# Patient Record
Sex: Female | Born: 1935 | Race: White | Hispanic: No | Marital: Married
Health system: Southern US, Community
[De-identification: ages and names within clinical notes are randomized; demographics above are authoritative.]

---

## 2012-10-08 ENCOUNTER — Encounter: Payer: Self-pay | Admitting: Internal Medicine

## 2012-10-09 LAB — PROTIME-INR: INR: 1.3

## 2012-10-23 LAB — PROTIME-INR: Prothrombin Time: 25.9 secs — ABNORMAL HIGH (ref 11.5–14.7)

## 2012-10-31 ENCOUNTER — Encounter: Payer: Self-pay | Admitting: Internal Medicine

## 2012-11-06 LAB — PROTIME-INR
INR: 3
Prothrombin Time: 29.9 secs — ABNORMAL HIGH (ref 11.5–14.7)

## 2012-12-09 ENCOUNTER — Encounter: Payer: Self-pay | Admitting: Internal Medicine

## 2012-12-09 LAB — PROTIME-INR: Prothrombin Time: 29.6 secs — ABNORMAL HIGH (ref 11.5–14.7)

## 2012-12-24 ENCOUNTER — Ambulatory Visit: Payer: Self-pay | Admitting: Internal Medicine

## 2012-12-31 ENCOUNTER — Encounter: Payer: Self-pay | Admitting: Internal Medicine

## 2013-01-08 LAB — PROTIME-INR: INR: 4.7

## 2013-01-15 LAB — BASIC METABOLIC PANEL
BUN: 26 mg/dL — ABNORMAL HIGH (ref 7–18)
Calcium, Total: 9 mg/dL (ref 8.5–10.1)
Chloride: 102 mmol/L (ref 98–107)
Creatinine: 0.74 mg/dL (ref 0.60–1.30)
Osmolality: 285 (ref 275–301)
Potassium: 3.3 mmol/L — ABNORMAL LOW (ref 3.5–5.1)
Sodium: 140 mmol/L (ref 136–145)

## 2013-01-15 LAB — PROTIME-INR
INR: 1.1
Prothrombin Time: 14.4 secs (ref 11.5–14.7)

## 2013-01-22 LAB — PROTIME-INR
INR: 2.5
Prothrombin Time: 26 secs — ABNORMAL HIGH (ref 11.5–14.7)

## 2013-01-29 LAB — PROTIME-INR: Prothrombin Time: 43.9 secs — ABNORMAL HIGH (ref 11.5–14.7)

## 2013-01-31 ENCOUNTER — Encounter: Payer: Self-pay | Admitting: Internal Medicine

## 2013-02-03 LAB — PROTIME-INR: Prothrombin Time: 31 secs — ABNORMAL HIGH (ref 11.5–14.7)

## 2013-02-10 LAB — PROTIME-INR
INR: 2.7
Prothrombin Time: 27.9 secs — ABNORMAL HIGH (ref 11.5–14.7)

## 2013-02-17 LAB — PROTIME-INR
INR: 2.9
Prothrombin Time: 29.8 secs — ABNORMAL HIGH (ref 11.5–14.7)

## 2013-02-24 LAB — PROTIME-INR: INR: 5.6

## 2013-02-24 LAB — TSH: Thyroid Stimulating Horm: 56.9 u[IU]/mL — ABNORMAL HIGH

## 2013-03-02 ENCOUNTER — Encounter: Payer: Self-pay | Admitting: Internal Medicine

## 2013-03-05 LAB — PROTIME-INR
INR: 1.9
Prothrombin Time: 21.3 secs — ABNORMAL HIGH (ref 11.5–14.7)

## 2013-03-12 LAB — PROTIME-INR
INR: 4.5
INR: 4.3
Prothrombin Time: 40.5 secs — ABNORMAL HIGH (ref 11.5–14.7)

## 2013-03-17 LAB — PROTIME-INR: Prothrombin Time: 22.5 secs — ABNORMAL HIGH (ref 11.5–14.7)

## 2013-03-24 LAB — PROTIME-INR: INR: 2.8

## 2013-03-31 LAB — PROTIME-INR: Prothrombin Time: 29.6 secs — ABNORMAL HIGH (ref 11.5–14.7)

## 2013-04-02 ENCOUNTER — Encounter: Payer: Self-pay | Admitting: Internal Medicine

## 2013-04-07 LAB — TSH: THYROID STIMULATING HORM: 10.9 u[IU]/mL — AB

## 2013-04-07 LAB — PROTIME-INR
INR: 4.9 — AB
Prothrombin Time: 43.7 secs — ABNORMAL HIGH (ref 11.5–14.7)

## 2013-04-09 LAB — PROTIME-INR
INR: 2.9
Prothrombin Time: 29.3 secs — ABNORMAL HIGH (ref 11.5–14.7)

## 2013-04-14 LAB — PROTIME-INR
INR: 1.8
Prothrombin Time: 20.5 secs — ABNORMAL HIGH (ref 11.5–14.7)

## 2013-04-21 LAB — PROTIME-INR
INR: 1.7
PROTHROMBIN TIME: 19.2 s — AB (ref 11.5–14.7)

## 2013-04-28 LAB — PROTIME-INR
INR: 1.8
Prothrombin Time: 20.4 secs — ABNORMAL HIGH (ref 11.5–14.7)

## 2013-05-03 ENCOUNTER — Encounter: Payer: Self-pay | Admitting: Internal Medicine

## 2013-05-05 LAB — PROTIME-INR
INR: 1.8
PROTHROMBIN TIME: 20.3 s — AB (ref 11.5–14.7)

## 2013-05-19 LAB — PROTIME-INR
INR: 2.3
PROTHROMBIN TIME: 24.3 s — AB (ref 11.5–14.7)

## 2013-05-31 ENCOUNTER — Encounter: Payer: Self-pay | Admitting: Internal Medicine

## 2013-06-02 LAB — PROTIME-INR
INR: 2.3
Prothrombin Time: 25.1 secs — ABNORMAL HIGH (ref 11.5–14.7)

## 2013-06-25 LAB — PROTIME-INR
INR: 2.1
Prothrombin Time: 23.2 secs — ABNORMAL HIGH (ref 11.5–14.7)

## 2013-07-01 ENCOUNTER — Encounter: Payer: Self-pay | Admitting: Internal Medicine

## 2013-07-02 LAB — PROTIME-INR
INR: 2
PROTHROMBIN TIME: 21.8 s — AB (ref 11.5–14.7)

## 2013-07-09 LAB — PROTIME-INR
INR: 2.1
Prothrombin Time: 23 secs — ABNORMAL HIGH (ref 11.5–14.7)

## 2013-07-21 LAB — PROTIME-INR
INR: 2.3
Prothrombin Time: 25.1 secs — ABNORMAL HIGH (ref 11.5–14.7)

## 2013-07-31 ENCOUNTER — Encounter: Payer: Self-pay | Admitting: Internal Medicine

## 2013-08-04 LAB — BASIC METABOLIC PANEL
ANION GAP: 6 — AB (ref 7–16)
BUN: 33 mg/dL — ABNORMAL HIGH (ref 7–18)
Calcium, Total: 8.9 mg/dL (ref 8.5–10.1)
Chloride: 105 mmol/L (ref 98–107)
Co2: 34 mmol/L — ABNORMAL HIGH (ref 21–32)
Creatinine: 1 mg/dL (ref 0.60–1.30)
EGFR (African American): 57 — ABNORMAL LOW
EGFR (Non-African Amer.): 49 — ABNORMAL LOW
Glucose: 85 mg/dL (ref 65–99)
OSMOLALITY: 295 (ref 275–301)
Potassium: 3.5 mmol/L (ref 3.5–5.1)
Sodium: 145 mmol/L (ref 136–145)

## 2013-08-04 LAB — PROTIME-INR
INR: 2.6
Prothrombin Time: 27.2 secs — ABNORMAL HIGH (ref 11.5–14.7)

## 2013-08-04 LAB — TSH: THYROID STIMULATING HORM: 5.95 u[IU]/mL — AB

## 2013-08-18 LAB — PROTIME-INR
INR: 2.3
Prothrombin Time: 25.1 secs — ABNORMAL HIGH (ref 11.5–14.7)

## 2013-08-27 ENCOUNTER — Ambulatory Visit: Payer: Self-pay | Admitting: Gerontology

## 2013-08-31 ENCOUNTER — Encounter: Payer: Self-pay | Admitting: Internal Medicine

## 2013-09-15 LAB — PROTIME-INR
INR: 2.7
Prothrombin Time: 27.9 secs — ABNORMAL HIGH (ref 11.5–14.7)

## 2013-09-29 LAB — PROTIME-INR
INR: 2.2
PROTHROMBIN TIME: 24.1 s — AB (ref 11.5–14.7)

## 2013-09-30 ENCOUNTER — Encounter: Payer: Self-pay | Admitting: Internal Medicine

## 2013-10-13 LAB — PROTIME-INR
INR: 2.4
Prothrombin Time: 25.9 secs — ABNORMAL HIGH (ref 11.5–14.7)

## 2013-10-27 LAB — PROTIME-INR
INR: 1.8
Prothrombin Time: 20.5 secs — ABNORMAL HIGH (ref 11.5–14.7)

## 2013-10-31 ENCOUNTER — Encounter: Payer: Self-pay | Admitting: Internal Medicine

## 2013-11-03 LAB — PROTIME-INR
INR: 2.2
Prothrombin Time: 23.7 secs — ABNORMAL HIGH (ref 11.5–14.7)

## 2013-11-10 LAB — PROTIME-INR
INR: 2.8
PROTHROMBIN TIME: 28.8 s — AB (ref 11.5–14.7)

## 2013-11-17 LAB — PROTIME-INR
INR: 2.7
Prothrombin Time: 28.3 secs — ABNORMAL HIGH (ref 11.5–14.7)

## 2013-12-01 ENCOUNTER — Encounter: Payer: Self-pay | Admitting: Internal Medicine

## 2013-12-01 LAB — PROTIME-INR
INR: 2.5
Prothrombin Time: 26.5 secs — ABNORMAL HIGH (ref 11.5–14.7)

## 2013-12-15 LAB — PROTIME-INR
INR: 2.8
Prothrombin Time: 28.4 secs — ABNORMAL HIGH (ref 11.5–14.7)

## 2013-12-25 LAB — PROTIME-INR
INR: 2
Prothrombin Time: 22.2 secs — ABNORMAL HIGH (ref 11.5–14.7)

## 2013-12-29 LAB — PROTIME-INR
INR: 3.9
PROTHROMBIN TIME: 36.7 s — AB (ref 11.5–14.7)

## 2013-12-31 ENCOUNTER — Encounter: Payer: Self-pay | Admitting: Internal Medicine

## 2014-01-04 LAB — PROTIME-INR
INR: 4.5 — AB
PROTHROMBIN TIME: 41.3 s — AB (ref 11.5–14.7)

## 2014-01-06 LAB — PROTIME-INR
INR: 3.5
Prothrombin Time: 34.2 secs — ABNORMAL HIGH (ref 11.5–14.7)

## 2014-01-08 ENCOUNTER — Ambulatory Visit: Payer: Self-pay | Admitting: Internal Medicine

## 2014-01-09 ENCOUNTER — Inpatient Hospital Stay: Payer: Self-pay | Admitting: Internal Medicine

## 2014-01-09 LAB — CBC
HCT: 29.4 % — ABNORMAL LOW (ref 35.0–47.0)
HGB: 9.2 g/dL — ABNORMAL LOW (ref 12.0–16.0)
MCH: 30.3 pg (ref 26.0–34.0)
MCHC: 31.3 g/dL — ABNORMAL LOW (ref 32.0–36.0)
MCV: 97 fL (ref 80–100)
PLATELETS: 206 10*3/uL (ref 150–440)
RBC: 3.04 10*6/uL — ABNORMAL LOW (ref 3.80–5.20)
RDW: 14.3 % (ref 11.5–14.5)
WBC: 13.1 10*3/uL — ABNORMAL HIGH (ref 3.6–11.0)

## 2014-01-09 LAB — PRO B NATRIURETIC PEPTIDE: B-TYPE NATIURETIC PEPTID: 7713 pg/mL — AB (ref 0–450)

## 2014-01-09 LAB — COMPREHENSIVE METABOLIC PANEL
ALK PHOS: 53 U/L
ALT: 12 U/L — AB
Albumin: 2.6 g/dL — ABNORMAL LOW (ref 3.4–5.0)
Anion Gap: 5 — ABNORMAL LOW (ref 7–16)
BILIRUBIN TOTAL: 1 mg/dL (ref 0.2–1.0)
BUN: 15 mg/dL (ref 7–18)
CO2: 32 mmol/L (ref 21–32)
Calcium, Total: 8.1 mg/dL — ABNORMAL LOW (ref 8.5–10.1)
Chloride: 106 mmol/L (ref 98–107)
Creatinine: 0.78 mg/dL (ref 0.60–1.30)
Glucose: 100 mg/dL — ABNORMAL HIGH (ref 65–99)
Potassium: 4.7 mmol/L (ref 3.5–5.1)
SGOT(AST): 29 U/L (ref 15–37)
Sodium: 143 mmol/L (ref 136–145)
Total Protein: 5.8 g/dL — ABNORMAL LOW (ref 6.4–8.2)

## 2014-01-09 LAB — PROTIME-INR
INR: 2.5
INR: 1.8
PROTHROMBIN TIME: 26.4 s — AB (ref 11.5–14.7)
Prothrombin Time: 20.8 secs — ABNORMAL HIGH (ref 11.5–14.7)

## 2014-01-09 LAB — CK TOTAL AND CKMB (NOT AT ARMC)
CK, Total: 58 U/L
CK-MB: 0.6 ng/mL (ref 0.5–3.6)

## 2014-01-09 LAB — TROPONIN I: TROPONIN-I: 0.06 ng/mL — AB

## 2014-01-10 ENCOUNTER — Ambulatory Visit: Payer: Self-pay | Admitting: Orthopedic Surgery

## 2014-01-10 LAB — TROPONIN I: TROPONIN-I: 0.05 ng/mL

## 2014-01-10 LAB — SEDIMENTATION RATE: ERYTHROCYTE SED RATE: 69 mm/h — AB (ref 0–30)

## 2014-01-11 LAB — CBC WITH DIFFERENTIAL/PLATELET
Basophil #: 0 10*3/uL (ref 0.0–0.1)
Basophil %: 0.6 %
EOS PCT: 2.6 %
Eosinophil #: 0.2 10*3/uL (ref 0.0–0.7)
HCT: 24.7 % — ABNORMAL LOW (ref 35.0–47.0)
HGB: 8.1 g/dL — ABNORMAL LOW (ref 12.0–16.0)
Lymphocyte #: 1.3 10*3/uL (ref 1.0–3.6)
Lymphocyte %: 17.4 %
MCH: 31.4 pg (ref 26.0–34.0)
MCHC: 32.6 g/dL (ref 32.0–36.0)
MCV: 96 fL (ref 80–100)
Monocyte #: 0.7 x10 3/mm (ref 0.2–0.9)
Monocyte %: 9.7 %
NEUTROS ABS: 5.2 10*3/uL (ref 1.4–6.5)
Neutrophil %: 69.7 %
Platelet: 166 10*3/uL (ref 150–440)
RBC: 2.57 10*6/uL — ABNORMAL LOW (ref 3.80–5.20)
RDW: 14.5 % (ref 11.5–14.5)
WBC: 7.5 10*3/uL (ref 3.6–11.0)

## 2014-01-11 LAB — BASIC METABOLIC PANEL
ANION GAP: 5 — AB (ref 7–16)
BUN: 20 mg/dL — ABNORMAL HIGH (ref 7–18)
CREATININE: 0.93 mg/dL (ref 0.60–1.30)
Calcium, Total: 8.2 mg/dL — ABNORMAL LOW (ref 8.5–10.1)
Chloride: 103 mmol/L (ref 98–107)
Co2: 33 mmol/L — ABNORMAL HIGH (ref 21–32)
EGFR (Non-African Amer.): >60
GLUCOSE: 84 mg/dL (ref 65–99)
Osmolality: 283 (ref 275–301)
POTASSIUM: 4.5 mmol/L (ref 3.5–5.1)
Sodium: 141 mmol/L (ref 136–145)

## 2014-01-11 LAB — PROTIME-INR
INR: 1.7
Prothrombin Time: 19.6 secs — ABNORMAL HIGH (ref 11.5–14.7)

## 2014-01-12 LAB — BASIC METABOLIC PANEL
ANION GAP: 7 (ref 7–16)
BUN: 33 mg/dL — AB (ref 7–18)
CALCIUM: 8.3 mg/dL — AB (ref 8.5–10.1)
CO2: 31 mmol/L (ref 21–32)
Chloride: 99 mmol/L (ref 98–107)
Creatinine: 1.23 mg/dL (ref 0.60–1.30)
EGFR (Non-African Amer.): 43 — ABNORMAL LOW
GFR CALC AF AMER: 53 — AB
Glucose: 93 mg/dL (ref 65–99)
OSMOLALITY: 281 (ref 275–301)
Potassium: 4.6 mmol/L (ref 3.5–5.1)
SODIUM: 137 mmol/L (ref 136–145)

## 2014-01-12 LAB — CBC WITH DIFFERENTIAL/PLATELET
BASOS PCT: 0.8 %
Basophil #: 0.1 10*3/uL (ref 0.0–0.1)
EOS ABS: 0.2 10*3/uL (ref 0.0–0.7)
EOS PCT: 2.4 %
HCT: 25.7 % — ABNORMAL LOW (ref 35.0–47.0)
HGB: 8.2 g/dL — AB (ref 12.0–16.0)
Lymphocyte #: 1.6 10*3/uL (ref 1.0–3.6)
Lymphocyte %: 20 %
MCH: 30.7 pg (ref 26.0–34.0)
MCHC: 31.8 g/dL — AB (ref 32.0–36.0)
MCV: 97 fL (ref 80–100)
MONO ABS: 0.9 x10 3/mm (ref 0.2–0.9)
MONOS PCT: 10.8 %
Neutrophil #: 5.3 10*3/uL (ref 1.4–6.5)
Neutrophil %: 66 %
PLATELETS: 189 10*3/uL (ref 150–440)
RBC: 2.66 10*6/uL — ABNORMAL LOW (ref 3.80–5.20)
RDW: 14.3 % (ref 11.5–14.5)
WBC: 8.1 10*3/uL (ref 3.6–11.0)

## 2014-01-12 LAB — PROTIME-INR
INR: 1.6
Prothrombin Time: 18.5 secs — ABNORMAL HIGH (ref 11.5–14.7)

## 2014-01-13 LAB — CBC WITH DIFFERENTIAL/PLATELET
Basophil #: 0 10*3/uL (ref 0.0–0.1)
Basophil %: 0.4 %
Eosinophil #: 0.1 10*3/uL (ref 0.0–0.7)
Eosinophil %: 2 %
HCT: 24.2 % — ABNORMAL LOW (ref 35.0–47.0)
HGB: 7.7 g/dL — ABNORMAL LOW (ref 12.0–16.0)
LYMPHS PCT: 15.5 %
Lymphocyte #: 1 10*3/uL (ref 1.0–3.6)
MCH: 30.6 pg (ref 26.0–34.0)
MCHC: 31.7 g/dL — AB (ref 32.0–36.0)
MCV: 97 fL (ref 80–100)
Monocyte #: 0.8 x10 3/mm (ref 0.2–0.9)
Monocyte %: 12.3 %
NEUTROS ABS: 4.7 10*3/uL (ref 1.4–6.5)
Neutrophil %: 69.8 %
Platelet: 180 10*3/uL (ref 150–440)
RBC: 2.5 10*6/uL — ABNORMAL LOW (ref 3.80–5.20)
RDW: 14.4 % (ref 11.5–14.5)
WBC: 6.7 10*3/uL (ref 3.6–11.0)

## 2014-01-13 LAB — PROTIME-INR
INR: 1.5
Prothrombin Time: 17.8 secs — ABNORMAL HIGH (ref 11.5–14.7)

## 2014-01-13 LAB — CREATININE, SERUM
Creatinine: 1.22 mg/dL (ref 0.60–1.30)
EGFR (African American): 53 — ABNORMAL LOW
EGFR (Non-African Amer.): 44 — ABNORMAL LOW

## 2014-01-19 ENCOUNTER — Emergency Department: Payer: Self-pay | Admitting: Emergency Medicine

## 2014-01-20 ENCOUNTER — Encounter: Payer: Self-pay | Admitting: Internal Medicine

## 2014-01-26 LAB — BASIC METABOLIC PANEL
ANION GAP: 9 (ref 7–16)
BUN: 17 mg/dL (ref 7–18)
CALCIUM: 8.3 mg/dL — AB (ref 8.5–10.1)
CHLORIDE: 98 mmol/L (ref 98–107)
CREATININE: 0.93 mg/dL (ref 0.60–1.30)
Co2: 38 mmol/L — ABNORMAL HIGH (ref 21–32)
EGFR (African American): >60
EGFR (Non-African Amer.): >60
Glucose: 86 mg/dL (ref 65–99)
Osmolality: 290 (ref 275–301)
Potassium: 3.4 mmol/L — ABNORMAL LOW (ref 3.5–5.1)
Sodium: 145 mmol/L (ref 136–145)

## 2014-01-26 LAB — CBC WITH DIFFERENTIAL/PLATELET
Basophil #: 0.1 10*3/uL (ref 0.0–0.1)
Basophil %: 0.8 %
EOS ABS: 0.2 10*3/uL (ref 0.0–0.7)
Eosinophil %: 2.7 %
HCT: 32.2 % — ABNORMAL LOW (ref 35.0–47.0)
HGB: 10.1 g/dL — ABNORMAL LOW (ref 12.0–16.0)
LYMPHS ABS: 2 10*3/uL (ref 1.0–3.6)
Lymphocyte %: 28.7 %
MCH: 29.8 pg (ref 26.0–34.0)
MCHC: 31.3 g/dL — ABNORMAL LOW (ref 32.0–36.0)
MCV: 95 fL (ref 80–100)
MONO ABS: 0.7 x10 3/mm (ref 0.2–0.9)
Monocyte %: 10.3 %
NEUTROS PCT: 57.5 %
Neutrophil #: 4 10*3/uL (ref 1.4–6.5)
Platelet: 302 10*3/uL (ref 150–440)
RBC: 3.39 10*6/uL — AB (ref 3.80–5.20)
RDW: 15.2 % — ABNORMAL HIGH (ref 11.5–14.5)
WBC: 7 10*3/uL (ref 3.6–11.0)

## 2014-01-26 LAB — PROTIME-INR
INR: 1.1
Prothrombin Time: 13.6 secs (ref 11.5–14.7)

## 2014-02-02 ENCOUNTER — Encounter: Payer: Self-pay | Admitting: Internal Medicine

## 2014-02-02 LAB — PROTIME-INR
INR: 1.1
Prothrombin Time: 14.2 secs (ref 11.5–14.7)

## 2014-02-05 ENCOUNTER — Ambulatory Visit: Payer: Self-pay | Admitting: Cardiovascular Disease

## 2014-03-02 ENCOUNTER — Encounter: Payer: Self-pay | Admitting: Internal Medicine

## 2014-03-23 LAB — PROTIME-INR
INR: 1.1
Prothrombin Time: 13.7 secs (ref 11.5–14.7)

## 2014-03-30 LAB — PROTIME-INR
INR: 1.2
Prothrombin Time: 15.1 secs — ABNORMAL HIGH (ref 11.5–14.7)

## 2014-04-02 ENCOUNTER — Encounter: Payer: Self-pay | Admitting: Internal Medicine

## 2014-04-06 LAB — PROTIME-INR
INR: 1.5
PROTHROMBIN TIME: 18.1 s — AB (ref 11.5–14.7)

## 2014-04-13 ENCOUNTER — Encounter: Payer: Self-pay | Admitting: Internal Medicine

## 2014-04-13 LAB — PROTIME-INR
INR: 2.2
Prothrombin Time: 23.8 secs — ABNORMAL HIGH (ref 11.5–14.7)

## 2014-04-27 LAB — PROTIME-INR
INR: 2.6
Prothrombin Time: 27.4 secs — ABNORMAL HIGH (ref 11.5–14.7)

## 2014-05-03 ENCOUNTER — Encounter: Payer: Self-pay | Admitting: Internal Medicine

## 2014-06-01 ENCOUNTER — Encounter
Admit: 2014-06-01 | Disposition: A | Discharge status: Home / Self Care | Payer: Self-pay | Attending: Internal Medicine | Admitting: Internal Medicine

## 2014-06-23 LAB — PROTIME-INR
INR: 3.2
Prothrombin Time: 32.9 s — ABNORMAL HIGH

## 2014-06-24 LAB — PROTIME-INR
INR: 2.6
Prothrombin Time: 27.6 secs — ABNORMAL HIGH

## 2014-06-28 LAB — PROTIME-INR
INR: 1.7
Prothrombin Time: 20.4 secs — ABNORMAL HIGH

## 2014-07-01 LAB — PROTIME-INR
INR: 2.8
Prothrombin Time: 29.9 secs — ABNORMAL HIGH

## 2014-07-02 ENCOUNTER — Encounter
Admit: 2014-07-02 | Disposition: A | Discharge status: Home / Self Care | Payer: Self-pay | Attending: Internal Medicine | Admitting: Internal Medicine

## 2014-07-08 LAB — PROTIME-INR
INR: 4.2
Prothrombin Time: 40.4 secs — ABNORMAL HIGH

## 2014-07-10 LAB — PROTIME-INR
INR: 2.8
Prothrombin Time: 29.2 secs — ABNORMAL HIGH

## 2014-07-13 LAB — PROTIME-INR
INR: 2.7
PROTHROMBIN TIME: 28.7 s — AB

## 2014-07-20 LAB — PROTIME-INR
INR: 2.6
PROTHROMBIN TIME: 27.6 s — AB

## 2014-07-24 NOTE — Discharge Summary (Signed)
PATIENT NAMLuiz Coleman:  Tomb, Shelene MR#:  [id number] DATE OF BIRTH:  1920-06-07  DATE OF ADMISSION:  01/09/2014 DATE OF DISCHARGE:  01/13/2014  DISCHARGE DIAGNOSES:  1.  Cellulitis.  2.  Large left leg hematoma, likely with hemarthrosis.  3.  Atrial fibrillation, off anticoagulation now secondary to above;  heart rate is stable on the usual medications.  4.  Critical aortic stenosis with pleural effusion causing chronic congestive heart failure, diastolic.  5.  Dyspnea from above. 6.  Chronic kidney disease 3.  DISCHARGE MEDICATIONS: [facility]; please see for details. Essentially will be on Levaquin for 5 days.   DISCHARGE INSTRUCTIONS: She also will need her to left leg wrapped with an Ace bandage and kept elevated at all times other than during therapy, using the restroom, etc. until the leg edema as much improved.   HOSPITAL COURSE: The patient was admitted, put on vancomycin for possible cellulitis of the leg with the left leg being red and swollen. She had a spontaneous hemarthrosis previously. Orthopedic consult basically agreed with that. An ultrasound was done, which showed no obvious abscess; it did have some fluid collection. Her white count was minimally high at 13 on admission; improved with antibiotics. Erythema is pretty much gone at this point. Cultures were negative. A GFR remained about 44, after IV fluid. The patient will have to go back on her diuretics, etc. now. She should start back on the warfarin in 2 weeks approximately. We will admit her to the rehabilitation floor for further physical therapy, close nursing care her leg, inability to walk, decreased level function, etc.   Please note, it took approximately 38 minutes to do discharge tasks today.    ____________________________ Marya AmslerMarshall W. Dareen PianoAnderson, MD mwa:MT D: 01/13/2014 08:28:55 ET T: 01/13/2014 08:58:25 ET JOB#: [id number]  cc: Marya AmslerMarshall W. Dareen PianoAnderson, MD, <Dictator> Lauro RegulusMARSHALL W ANDERSON  MD ELECTRONICALLY SIGNED 01/14/2014 9:11

## 2014-07-24 NOTE — H&P (Signed)
PATIENT NAMEKINDELL, [name] MR#:  [id number] DATE OF BIRTH:  October 11, 1920  DATE OF ADMISSION:  01/09/2014  REFERRING PHYSICIAN: Sharyn Creamer, MD  PRIMARY CARE PHYSICIAN: Clide Cliff, MD of [facility].   CHIEF COMPLAINT: Per patient, shortness of breath.   HISTORY OF PRESENT ILLNESS: This is a 81 year old Caucasian female with past medical history of aortic stenosis, atrial fibrillation, presenting with confusion from [place].  The patient resides at [facility]. EMS was called for confusion, however, upon their arrival the patient appeared to no longer be confused. The patient subsequently complained about being short of breath for "a few nights", thus subsequently brought to the hospital for further work-up and evaluation. She was saturating in the low 90s on room air. When asked further questioning she denies any orthopnea, PND, chest pain, cough, fevers, chills. She does attest to having some lower extremity edema which is chronic in nature. Her largest complaint at this time is found to be left leg pain. She states that she developed a large bruise about 4 to 5 days ago. She is unaware of any trauma and was actually evaluated by her PCP for this and had no clear etiology aside from she was supratherapeutic of her INR. Currently she is no longer complaining of shortness of breath. She is oriented x 3, however, she does have a leg hematoma with what appears to be a large area of surrounding erythema.   REVIEW OF SYSTEMS: CONSTITUTIONAL: Denies fevers, chills, fatigue, weakness.  EYES: Denies blurred vision, double vision or eye pain.   EARS, NOSE, THROAT: Denies tinnitus, ear pain, hearing loss.  RESPIRATORY: Denies cough, wheeze. Positive for shortness of breath as described above.  CARDIOVASCULAR: Denies chest pain, palpitations. Positive for edema as described above.  GASTROINTESTINAL: Denies nausea, vomiting, diarrhea, abdominal pain.  GENITOURINARY: Denies dysuria,  hematuria.  ENDOCRINE: Denies nocturia or thyroid problems.  HEMATOLOGY AND LYMPHATIC: Positive for easy bruising. Denies bleeding.  SKIN: Positive for large bruise over her left lateral knee. Denies any further rashes or lesions.  MUSCULOSKELETAL: Positive for left leg pain as described above. Denies any pain in neck, back, shoulders, or hips. Denies further arthritic symptoms.   NEUROLOGIC: Denies paralysis, paresthesias.  PSYCHIATRIC: Denies anxiety or depressive symptoms.   Otherwise, full review of systems performed by me is negative.   PAST MEDICAL HISTORY: Aortic stenosis, atrial fibrillation, which is chronic, chronic degenerative joint disease, hypertension, hypothyroidism, anxiety not otherwise specified.   SOCIAL HISTORY: No alcohol, tobacco, or drug use. Uses a wheelchair for ambulation.   FAMILY HISTORY: No known cardiovascular or pulmonary disorders.   ALLERGIES: No known drug allergies.   HOME MEDICATIONS: Include prednisone 5 mg p.o. q. daily, tramadol 50 mg p.o. q. daily, Tylenol 325 mg p.o. every 6 hours as needed for pain, amiodarone 100 mg 2 tabs p.o. q. daily, diltiazem 180 mg p.o. q. daily, warfarin 2.5 mg p.o. q. daily, sertraline 10 mg p.o. q. daily, benzonatate 100 mg p.o. b.i.d. for cough, alprazolam 0.25 mg p.o. b.i.d. as needed for anxiety, triamcinolone topical cream 0.1% to affected area 3 times daily, Lasix 40 mg p.o. q. daily, metolazone 5 mg p.o. 3 times a week, MiraLax 17 grams daily, senna 8.6 mg p.o. at bedtime and levothyroxine 100 mcg p.o. q. daily.   PHYSICAL EXAMINATION:  VITAL SIGNS: Temperature 99.2, heart rate 72, respirations 26, blood pressure 127/75, saturating 91% on room air, saturating 98% on 1.5 liters nasal cannula. Weight 74.4 kg, BMI 30.  GENERAL: Well-nourished,  well-developed, Caucasian female, currently in no acute distress.  HEAD: Normocephalic, atraumatic.  EYES: Pupils equal, round and reactive to light. Extraocular muscles intact.  No scleral icterus.  MOUTH: Moist mucosal membrane. Dentition intact. No abscess noted.  EARS, NOSE AND THROAT: Clear without exudates. No external lesions.  NECK: Supple. No thyromegaly. No nodules. No JVD.  PULMONARY: Diminished breath sounds at bases, most prominent over the left base. However, there are no wheezes, rales or rhonchi. No use of accessory muscles. Good respiratory effort.  CHEST: Nontender to palpation.  CARDIOVASCULAR: S1 and S2, irregular rate, irregular rhythm with a 3/6 systolic ejection murmur best heard at right upper sternal border. Trace pedal edema to shins bilaterally, 1+ sacral edema. Pedal pulses 2+ bilaterally.  GASTROINTESTINAL: Soft, nontender, nondistended. No masses. Positive bowel sounds. No hepatosplenomegaly.  MUSCULOSKELETAL: There is minimal swelling around the lateral aspect of the left knee as well as edema as described above. Range of motion is full in all extremities. There is no clubbing.  NEUROLOGIC: Cranial nerves II through XII intact. No gross focal neurological deficits. Sensation intact. Reflexes intact.  SKIN: A large area of ecchymosis over left lateral knee as mentioned above. There is also an area of erythema extending proximally to the mid thigh and distally to the mid lower leg which is warm to touch. No further lesions, rashes, or cyanosis. Skin warm, dry. Turgor intact.  PSYCHIATRIC: Mood and affect within normal limits. The patient is awake, alert, oriented x 3. Insight and judgment intact.   LABORATORY DATA: EKG performed reveals atrial fibrillation which is rate controlled. No ST or T wave abnormalities. Sodium 143, potassium 4.7, chloride 106, bicarbonate 32, BUN 15, creatinine 0.78, glucose 100, protein of 5.8, albumin of 2.6, otherwise LFTs within normal limits. Troponin I 0.06, CK 58, CK-MB 0.6. WBC of 13.1, hemoglobin 9.2, platelets of 206,000. INR 1.8. Chest x-ray performed which reveals moderate left-sided pleural effusion, which is  actually improved from prior examinations. Overall appearance of chest x-ray is greatly improved from previous.   ASSESSMENT AND PLAN: A 81 year old Caucasian female with history of aortic stenosis and chronic atrial fibrillation on anticoagulation, presenting with complaints of confusion and shortness of breath and leg pain.   1. Sepsis, secondary to cellulitis. Meeting septic criteria by respiratory rate, leukocytosis present on admission. Panculture including blood. Antibiotic coverage with vancomycin. Follow culture data and adjust antibiotics as required.  2. Elevated troponin without clear cardiac symptoms. We will give aspirin and place on telemetry. Trend cardiac enzymes.  3. Atrial fibrillation which was rate controlled. Continue Cardizem, amiodarone and therapeutic warfarin.  4. Hypothyroidism. Continue Synthroid.  5. Shortness of breath, likely multifactorial in culmination with aortic stenosis and stable pleural effusion. We will add incentive spirometry as well as p.r.n. DuoNeb treatments as required for shortness of breath. 6. Pulmonary thromboembolism prophylaxis with warfarin.   CODE STATUS: The patient is DO NOT RESUSCITATE.   TIME SPENT: 45 minutes.    ____________________________ Cletis Athensavid K. Krikor Willet, MD dkh:JT D: 01/09/2014 23:49:06 ET T: 01/10/2014 00:47:26 ET JOB#: [id number]  cc: Cletis Athensavid K. Carmelite Violet, MD, <Dictator> Olson Lucarelli Synetta ShadowK Yoko Mcgahee MD ELECTRONICALLY SIGNED 01/10/2014 20:27

## 2014-07-24 NOTE — Consult Note (Signed)
PATIENT NAMEMARNISHA, [name] MR#:  [id number] DATE OF BIRTH:  [birth year]  DATE OF CONSULTATION:  01/10/2014  CONSULTING PHYSICIAN:  Maebelle Munroe, MD  CHIEF COMPLAINT: Left thigh pain and erythema.   HISTORY OF PRESENT ILLNESS: [name] is a 81 year old female who has undergone a knee replacement by her history approximately 2 years ago. She was noted to have over anticoagulation with INR up to 4.5 with marked swelling of the lateral aspect of the left distal thigh and knee area, which then proceeded to become erythematous. She was admitted for cellulitis and possible sepsis, which was stated by the hospitalist she did not have. She complains of sharp intermittent pain over the lateral aspect of the knee.   PAST MEDICAL HISTORY: Includes aortic stenosis, atrial fibrillation and history of dementia, hypothyroidism, hypertension, and anxiety.   PAST SURGICAL HISTORY: Remarkable for left total knee replacement.   SOCIAL HISTORY: Nontobacco and non-alcohol user.  Currently uses a wheelchair for ambulation.  FAMILY HISTORY: Unremarkable for cardiovascular or pulmonary disorders.   ALLERGIES: No known drug allergies.   CURRENT MEDICATIONS: Include prednisone, tramadol, Tylenol, amiodarone, diltiazem, warfarin, sertraline, benzonatate, alprazolam, triamcinolone, Lasix, metolazone MiraLax, Senna and levothyroxine.   PHYSICAL EXAMINATION: GENERAL: Pleasant, alert, elderly female.  PSYCHIATRIC: Mood and affect appropriate.  VITAL SIGNS: On presentation to the orthopedic ward include temperature of 98.7, pulse of 60, respirations 20, blood pressure 100/52, saturation 86% on 1 liter oxygen.  LYMPHATIC: Moderate swelling of the left thigh. SKIN: Diffuse erythema, especially over the lateral aspect of the thigh over an area of approximately 15 x 20 cm. There is focal tenderness to palpation in this region.  NEUROLOGIC:  Motor and light touch sensation examination intact of the left foot.  VASCULAR:  2+ dorsalis pedis pulse.  MUSCULOSKELETAL: Minimal effusion of the left knee, range of motion 0 to 30 degrees limited by pain, moderate swelling and erythema over the lateral aspect of the distal thigh as well as at the knee. There is minimal effusion.  Radiographs of the left knee, including AP and lateral with obliques demonstrated well fixed total knee replacement with excellent position. No evidence of loosening. There is no evidence of fracture or dislocation.   LABORATORY: Laboratory evaluation reveals an ESR of 69 and an elevated white blood cell count, which was performed yesterday of 13,000. Differential was not performed.   IMPRESSION:  1.  Over anticoagulation.  2. Left thigh possible hematoma with accompanying cellulitis. Given the ecchymosis present over the lateral aspect of the distal thigh, trauma to this region is certainly likely, though the patient denies it.  3.  Possible abscess, left lateral thigh.  4.  Minimal suspicion for left knee prosthetic joint infection.   PLAN: We are awaiting a C-reactive protein. Continue IV antibiotics. I have ordered an ultrasound of the region of maximal swelling of the distal thigh to evaluate fluid collection. If there is a fluid collection, I have requested that the radiology team perform an aspiration with gram stain and cultures of this region. I have communicated this to Dr. Laurice Record. Hooten who will pick the patient up tomorrow morning.     ____________________________ Maebelle Munroe, MD jfs:hh D: 01/10/2014 22:34:54 ET T: 01/10/2014 22:57:22 ET JOB#: [id number]  cc: Maebelle Munroe, MD, <Dictator> Laurice Record. Holley Bouche., MD Maebelle Munroe MD ELECTRONICALLY SIGNED 02/18/2014 10:11

## 2014-08-02 ENCOUNTER — Encounter
Admission: RE | Admit: 2014-08-02 | Discharge: 2014-08-02 | Disposition: A | Discharge status: Home / Self Care | Source: Ambulatory Visit | Attending: Internal Medicine | Admitting: Internal Medicine

## 2014-08-02 DIAGNOSIS — I4891 Unspecified atrial fibrillation: Secondary | ICD-10-CM | POA: Insufficient documentation

## 2014-08-03 DIAGNOSIS — I4891 Unspecified atrial fibrillation: Secondary | ICD-10-CM | POA: Diagnosis not present

## 2014-08-03 LAB — PROTIME-INR
INR: 2.51
Prothrombin Time: 27.2 seconds — ABNORMAL HIGH (ref 11.4–15.0)

## 2014-08-17 DIAGNOSIS — I4891 Unspecified atrial fibrillation: Secondary | ICD-10-CM | POA: Diagnosis not present

## 2014-08-17 LAB — PROTIME-INR
INR: 2.16
PROTHROMBIN TIME: 24.2 s — AB (ref 11.4–15.0)

## 2014-08-31 DIAGNOSIS — I4891 Unspecified atrial fibrillation: Secondary | ICD-10-CM | POA: Diagnosis not present

## 2014-08-31 LAB — PROTIME-INR
INR: 2.02
Prothrombin Time: 23 seconds — ABNORMAL HIGH (ref 11.4–15.0)

## 2014-09-02 ENCOUNTER — Encounter
Admission: RE | Admit: 2014-09-02 | Discharge: 2014-09-02 | Disposition: A | Discharge status: Home / Self Care | Payer: Medicare Other | Source: Ambulatory Visit | Attending: Internal Medicine | Admitting: Internal Medicine

## 2014-09-02 DIAGNOSIS — I4891 Unspecified atrial fibrillation: Secondary | ICD-10-CM | POA: Insufficient documentation

## 2014-09-07 ENCOUNTER — Other Ambulatory Visit
Admission: RE | Admit: 2014-09-07 | Discharge: 2014-09-07 | Disposition: A | Discharge status: Home / Self Care | Source: Skilled Nursing Facility | Attending: Internal Medicine | Admitting: Internal Medicine

## 2014-09-07 DIAGNOSIS — I4891 Unspecified atrial fibrillation: Secondary | ICD-10-CM | POA: Insufficient documentation

## 2014-09-08 LAB — PROTIME-INR
INR: 1.39
PROTHROMBIN TIME: 17.3 s — AB (ref 11.4–15.0)

## 2014-09-14 DIAGNOSIS — I4891 Unspecified atrial fibrillation: Secondary | ICD-10-CM | POA: Diagnosis not present

## 2014-09-14 LAB — PROTIME-INR
INR: 2.68
Prothrombin Time: 28.6 seconds — ABNORMAL HIGH (ref 11.4–15.0)

## 2014-09-21 DIAGNOSIS — I4891 Unspecified atrial fibrillation: Secondary | ICD-10-CM | POA: Diagnosis not present

## 2014-09-21 LAB — PROTIME-INR
INR: 2.64
Prothrombin Time: 28.3 seconds — ABNORMAL HIGH (ref 11.4–15.0)

## 2014-10-04 ENCOUNTER — Encounter
Admission: RE | Admit: 2014-10-04 | Discharge: 2014-10-04 | Disposition: A | Discharge status: Home / Self Care | Payer: Medicare Other | Source: Ambulatory Visit | Attending: Internal Medicine | Admitting: Internal Medicine

## 2014-10-04 DIAGNOSIS — I482 Chronic atrial fibrillation: Secondary | ICD-10-CM | POA: Insufficient documentation

## 2014-10-07 DIAGNOSIS — I482 Chronic atrial fibrillation: Secondary | ICD-10-CM | POA: Diagnosis not present

## 2014-10-07 LAB — PROTIME-INR
INR: 2.84
Prothrombin Time: 29.9 seconds — ABNORMAL HIGH (ref 11.4–15.0)

## 2014-10-19 DIAGNOSIS — I482 Chronic atrial fibrillation: Secondary | ICD-10-CM | POA: Diagnosis not present

## 2014-10-19 LAB — PROTIME-INR
INR: 2.74
PROTHROMBIN TIME: 29.1 s — AB (ref 11.4–15.0)

## 2014-10-26 DIAGNOSIS — I482 Chronic atrial fibrillation: Secondary | ICD-10-CM | POA: Diagnosis not present

## 2014-10-26 LAB — PROTIME-INR
INR: 3.71
PROTHROMBIN TIME: 36.7 s — AB (ref 11.4–15.0)

## 2014-11-01 ENCOUNTER — Encounter
Admission: RE | Admit: 2014-11-01 | Discharge: 2014-11-01 | Disposition: A | Discharge status: Home / Self Care | Payer: Medicare Other | Source: Ambulatory Visit | Attending: Internal Medicine | Admitting: Internal Medicine

## 2014-11-01 DIAGNOSIS — I4891 Unspecified atrial fibrillation: Secondary | ICD-10-CM | POA: Insufficient documentation

## 2014-11-02 DIAGNOSIS — I4891 Unspecified atrial fibrillation: Secondary | ICD-10-CM | POA: Diagnosis not present

## 2014-11-02 LAB — PROTIME-INR
INR: 2.91
Prothrombin Time: 30.5 seconds — ABNORMAL HIGH (ref 11.4–15.0)

## 2014-11-09 DIAGNOSIS — I4891 Unspecified atrial fibrillation: Secondary | ICD-10-CM | POA: Diagnosis not present

## 2014-11-09 LAB — COMPREHENSIVE METABOLIC PANEL
ALBUMIN: 3.2 g/dL — AB (ref 3.5–5.0)
ALT: 11 U/L — ABNORMAL LOW (ref 14–54)
AST: 19 U/L (ref 15–41)
Alkaline Phosphatase: 47 U/L (ref 38–126)
Anion gap: 8 (ref 5–15)
BILIRUBIN TOTAL: 0.5 mg/dL (ref 0.3–1.2)
BUN: 39 mg/dL — AB (ref 6–20)
CO2: 35 mmol/L — ABNORMAL HIGH (ref 22–32)
Calcium: 8.7 mg/dL — ABNORMAL LOW (ref 8.9–10.3)
Chloride: 99 mmol/L — ABNORMAL LOW (ref 101–111)
Creatinine, Ser: 0.85 mg/dL (ref 0.44–1.00)
GFR calc Af Amer: >60 mL/min (ref >60)
GFR calc non Af Amer: 58 mL/min — ABNORMAL LOW (ref >60)
Glucose, Bld: 84 mg/dL (ref 65–99)
Potassium: 3.3 mmol/L — ABNORMAL LOW (ref 3.5–5.1)
Sodium: 142 mmol/L (ref 135–145)
TOTAL PROTEIN: 5.7 g/dL — AB (ref 6.5–8.1)

## 2014-11-09 LAB — PROTIME-INR
INR: 2.86
Prothrombin Time: 30.1 seconds — ABNORMAL HIGH (ref 11.4–15.0)

## 2014-11-09 LAB — TSH: TSH: 1.743 u[IU]/mL (ref 0.350–4.500)

## 2014-11-16 DIAGNOSIS — I4891 Unspecified atrial fibrillation: Secondary | ICD-10-CM | POA: Diagnosis not present

## 2014-11-16 LAB — BASIC METABOLIC PANEL
Anion gap: 7 (ref 5–15)
BUN: 35 mg/dL — AB (ref 6–20)
CALCIUM: 8.9 mg/dL (ref 8.9–10.3)
CO2: 33 mmol/L — ABNORMAL HIGH (ref 22–32)
CREATININE: 0.86 mg/dL (ref 0.44–1.00)
Chloride: 102 mmol/L (ref 101–111)
GFR calc Af Amer: >60 mL/min (ref >60)
GFR calc non Af Amer: 57 mL/min — ABNORMAL LOW (ref >60)
Glucose, Bld: 82 mg/dL (ref 65–99)
POTASSIUM: 4.4 mmol/L (ref 3.5–5.1)
Sodium: 142 mmol/L (ref 135–145)

## 2014-11-16 LAB — PROTIME-INR
INR: 3.2
Prothrombin Time: 32.8 seconds — ABNORMAL HIGH (ref 11.4–15.0)

## 2014-11-23 DIAGNOSIS — I4891 Unspecified atrial fibrillation: Secondary | ICD-10-CM | POA: Diagnosis not present

## 2014-11-23 LAB — PROTIME-INR
INR: 2.73
PROTHROMBIN TIME: 29 s — AB (ref 11.4–15.0)

## 2014-11-30 DIAGNOSIS — I4891 Unspecified atrial fibrillation: Secondary | ICD-10-CM | POA: Diagnosis not present

## 2014-11-30 LAB — PROTIME-INR
INR: 1.96
Prothrombin Time: 22.5 seconds — ABNORMAL HIGH (ref 11.4–15.0)

## 2014-12-02 ENCOUNTER — Encounter
Admission: RE | Admit: 2014-12-02 | Discharge: 2014-12-02 | Disposition: A | Discharge status: Home / Self Care | Payer: Medicare Other | Source: Ambulatory Visit | Attending: Internal Medicine | Admitting: Internal Medicine

## 2014-12-02 DIAGNOSIS — I4891 Unspecified atrial fibrillation: Secondary | ICD-10-CM | POA: Insufficient documentation

## 2014-12-07 DIAGNOSIS — I4891 Unspecified atrial fibrillation: Secondary | ICD-10-CM | POA: Diagnosis present

## 2014-12-07 LAB — PROTIME-INR
INR: 2.67
PROTHROMBIN TIME: 28.5 s — AB (ref 11.4–15.0)

## 2014-12-14 DIAGNOSIS — I4891 Unspecified atrial fibrillation: Secondary | ICD-10-CM | POA: Diagnosis not present

## 2014-12-14 LAB — PROTIME-INR
INR: 2.04
PROTHROMBIN TIME: 23.2 s — AB (ref 11.4–15.0)

## 2014-12-21 DIAGNOSIS — I4891 Unspecified atrial fibrillation: Secondary | ICD-10-CM | POA: Diagnosis not present

## 2014-12-21 LAB — PROTIME-INR
INR: 2.84
PROTHROMBIN TIME: 29.9 s — AB (ref 11.4–15.0)

## 2014-12-28 DIAGNOSIS — I4891 Unspecified atrial fibrillation: Secondary | ICD-10-CM | POA: Diagnosis not present

## 2014-12-28 LAB — PROTIME-INR
INR: 2.64
Prothrombin Time: 28.3 seconds — ABNORMAL HIGH (ref 11.4–15.0)

## 2015-01-01 ENCOUNTER — Encounter
Admission: RE | Admit: 2015-01-01 | Discharge: 2015-01-01 | Disposition: A | Discharge status: Home / Self Care | Payer: Medicare Other | Source: Ambulatory Visit | Attending: Internal Medicine | Admitting: Internal Medicine

## 2015-01-01 DIAGNOSIS — I4891 Unspecified atrial fibrillation: Secondary | ICD-10-CM | POA: Insufficient documentation

## 2015-01-11 DIAGNOSIS — I4891 Unspecified atrial fibrillation: Secondary | ICD-10-CM | POA: Diagnosis present

## 2015-01-11 LAB — PROTIME-INR
INR: 1.61
PROTHROMBIN TIME: 19.3 s — AB (ref 11.4–15.0)

## 2015-01-20 ENCOUNTER — Other Ambulatory Visit
Admission: RE | Admit: 2015-01-20 | Discharge: 2015-01-20 | Disposition: A | Discharge status: Home / Self Care | Payer: Medicare Other | Source: Skilled Nursing Facility | Attending: Internal Medicine | Admitting: Internal Medicine

## 2015-01-20 DIAGNOSIS — I4891 Unspecified atrial fibrillation: Secondary | ICD-10-CM | POA: Insufficient documentation

## 2015-01-20 LAB — PROTIME-INR
INR: 2.21
PROTHROMBIN TIME: 24.7 s — AB (ref 11.4–15.0)

## 2015-01-28 DIAGNOSIS — I4891 Unspecified atrial fibrillation: Secondary | ICD-10-CM | POA: Diagnosis not present

## 2015-01-28 LAB — PROTIME-INR
INR: 2.17
PROTHROMBIN TIME: 24.3 s — AB (ref 11.4–15.0)

## 2015-02-01 ENCOUNTER — Encounter
Admission: RE | Admit: 2015-02-01 | Discharge: 2015-02-01 | Disposition: A | Discharge status: Home / Self Care | Payer: Medicare Other | Source: Ambulatory Visit | Attending: Internal Medicine | Admitting: Internal Medicine

## 2015-02-01 DIAGNOSIS — I4891 Unspecified atrial fibrillation: Secondary | ICD-10-CM | POA: Insufficient documentation

## 2015-02-08 DIAGNOSIS — I4891 Unspecified atrial fibrillation: Secondary | ICD-10-CM | POA: Diagnosis not present

## 2015-02-08 LAB — PROTIME-INR
INR: 2.25
Prothrombin Time: 25 seconds — ABNORMAL HIGH (ref 11.4–15.0)

## 2015-02-22 DIAGNOSIS — I4891 Unspecified atrial fibrillation: Secondary | ICD-10-CM | POA: Diagnosis not present

## 2015-02-22 LAB — PROTIME-INR
INR: 3.36
PROTHROMBIN TIME: 33.3 s — AB (ref 11.4–15.0)

## 2015-02-25 DIAGNOSIS — I4891 Unspecified atrial fibrillation: Secondary | ICD-10-CM | POA: Diagnosis not present

## 2015-02-25 LAB — PROTIME-INR
INR: 2.59
Prothrombin Time: 27.4 seconds — ABNORMAL HIGH (ref 11.4–15.0)

## 2015-03-03 ENCOUNTER — Encounter
Admission: RE | Admit: 2015-03-03 | Discharge: 2015-03-03 | Disposition: A | Discharge status: Home / Self Care | Payer: Medicare Other | Source: Ambulatory Visit | Attending: Internal Medicine | Admitting: Internal Medicine

## 2015-03-03 DIAGNOSIS — I4891 Unspecified atrial fibrillation: Secondary | ICD-10-CM | POA: Insufficient documentation

## 2015-03-08 DIAGNOSIS — I4891 Unspecified atrial fibrillation: Secondary | ICD-10-CM | POA: Diagnosis present

## 2015-03-08 LAB — PROTIME-INR
INR: 1.73
Prothrombin Time: 20.2 seconds — ABNORMAL HIGH (ref 11.4–15.0)

## 2015-03-15 DIAGNOSIS — I4891 Unspecified atrial fibrillation: Secondary | ICD-10-CM | POA: Diagnosis not present

## 2015-03-15 LAB — PROTIME-INR
INR: 3.08
Prothrombin Time: 31.2 seconds — ABNORMAL HIGH (ref 11.4–15.0)

## 2015-03-22 DIAGNOSIS — I4891 Unspecified atrial fibrillation: Secondary | ICD-10-CM | POA: Diagnosis not present

## 2015-03-22 LAB — PROTIME-INR
INR: 3.21
PROTHROMBIN TIME: 32.2 s — AB (ref 11.4–15.0)

## 2015-03-25 DIAGNOSIS — I4891 Unspecified atrial fibrillation: Secondary | ICD-10-CM | POA: Diagnosis not present

## 2015-03-25 LAB — PROTIME-INR
INR: 1.81
Prothrombin Time: 20.9 seconds — ABNORMAL HIGH (ref 11.4–15.0)

## 2015-04-03 ENCOUNTER — Encounter
Admission: RE | Admit: 2015-04-03 | Discharge: 2015-04-03 | Disposition: A | Discharge status: Home / Self Care | Payer: Medicare Other | Source: Ambulatory Visit | Attending: Internal Medicine | Admitting: Internal Medicine

## 2015-04-03 DIAGNOSIS — I4891 Unspecified atrial fibrillation: Secondary | ICD-10-CM | POA: Insufficient documentation

## 2015-04-05 DIAGNOSIS — I4891 Unspecified atrial fibrillation: Secondary | ICD-10-CM | POA: Diagnosis not present

## 2015-04-05 LAB — PROTIME-INR
INR: 2.13
PROTHROMBIN TIME: 23.7 s — AB (ref 11.4–15.0)

## 2015-04-12 DIAGNOSIS — I4891 Unspecified atrial fibrillation: Secondary | ICD-10-CM | POA: Diagnosis not present

## 2015-04-12 LAB — PROTIME-INR
INR: 2.38
Prothrombin Time: 25.7 seconds — ABNORMAL HIGH (ref 11.4–15.0)

## 2015-04-26 DIAGNOSIS — I4891 Unspecified atrial fibrillation: Secondary | ICD-10-CM | POA: Diagnosis not present

## 2015-04-26 LAB — PROTIME-INR
INR: 1.76
Prothrombin Time: 20.5 seconds — ABNORMAL HIGH (ref 11.4–15.0)

## 2015-05-03 DIAGNOSIS — I4891 Unspecified atrial fibrillation: Secondary | ICD-10-CM | POA: Diagnosis not present

## 2015-05-03 LAB — PROTIME-INR
INR: 2.99
Prothrombin Time: 30.5 seconds — ABNORMAL HIGH (ref 11.4–15.0)

## 2015-05-04 ENCOUNTER — Encounter
Admission: RE | Admit: 2015-05-04 | Discharge: 2015-05-04 | Disposition: A | Discharge status: Home / Self Care | Payer: Medicare Other | Source: Ambulatory Visit | Attending: Internal Medicine | Admitting: Internal Medicine

## 2015-05-04 DIAGNOSIS — I4891 Unspecified atrial fibrillation: Secondary | ICD-10-CM | POA: Insufficient documentation

## 2015-05-17 DIAGNOSIS — I4891 Unspecified atrial fibrillation: Secondary | ICD-10-CM | POA: Diagnosis not present

## 2015-05-17 LAB — PROTIME-INR
INR: 3.16
PROTHROMBIN TIME: 31.8 s — AB (ref 11.4–15.0)

## 2015-05-24 DIAGNOSIS — I4891 Unspecified atrial fibrillation: Secondary | ICD-10-CM | POA: Diagnosis not present

## 2015-05-24 LAB — PROTIME-INR
INR: 4.61
Prothrombin Time: 42.3 seconds — ABNORMAL HIGH (ref 11.4–15.0)

## 2015-05-26 DIAGNOSIS — I4891 Unspecified atrial fibrillation: Secondary | ICD-10-CM | POA: Diagnosis not present

## 2015-05-26 LAB — PROTIME-INR
INR: 3.71
PROTHROMBIN TIME: 35.9 s — AB (ref 11.4–15.0)

## 2015-05-29 DIAGNOSIS — I4891 Unspecified atrial fibrillation: Secondary | ICD-10-CM | POA: Diagnosis not present

## 2015-05-29 LAB — PROTIME-INR
INR: 2.38
Prothrombin Time: 25.7 seconds — ABNORMAL HIGH (ref 11.4–15.0)

## 2015-05-31 DIAGNOSIS — I4891 Unspecified atrial fibrillation: Secondary | ICD-10-CM | POA: Diagnosis not present

## 2015-05-31 LAB — PROTIME-INR
INR: 2.27
Prothrombin Time: 24.8 seconds — ABNORMAL HIGH (ref 11.4–15.0)

## 2015-06-01 ENCOUNTER — Encounter
Admission: RE | Admit: 2015-06-01 | Discharge: 2015-06-01 | Disposition: A | Discharge status: Home / Self Care | Payer: Medicare Other | Source: Ambulatory Visit | Attending: Internal Medicine | Admitting: Internal Medicine

## 2015-06-01 DIAGNOSIS — D649 Anemia, unspecified: Secondary | ICD-10-CM | POA: Insufficient documentation

## 2015-06-01 DIAGNOSIS — N189 Chronic kidney disease, unspecified: Secondary | ICD-10-CM | POA: Insufficient documentation

## 2015-06-01 DIAGNOSIS — I4891 Unspecified atrial fibrillation: Secondary | ICD-10-CM | POA: Insufficient documentation

## 2015-06-01 DIAGNOSIS — R531 Weakness: Secondary | ICD-10-CM | POA: Insufficient documentation

## 2015-06-07 DIAGNOSIS — I4891 Unspecified atrial fibrillation: Secondary | ICD-10-CM | POA: Diagnosis present

## 2015-06-07 DIAGNOSIS — N189 Chronic kidney disease, unspecified: Secondary | ICD-10-CM | POA: Diagnosis present

## 2015-06-07 DIAGNOSIS — D649 Anemia, unspecified: Secondary | ICD-10-CM | POA: Diagnosis present

## 2015-06-07 DIAGNOSIS — R531 Weakness: Secondary | ICD-10-CM | POA: Diagnosis not present

## 2015-06-07 LAB — LIPID PANEL
CHOL/HDL RATIO: 3.4 ratio
Cholesterol: 149 mg/dL (ref 0–200)
HDL: 44 mg/dL (ref >40)
LDL CALC: 90 mg/dL (ref 0–99)
TRIGLYCERIDES: 74 mg/dL (ref <150)
VLDL: 15 mg/dL (ref 0–40)

## 2015-06-07 LAB — CBC WITH DIFFERENTIAL/PLATELET
Basophils Absolute: 0 10*3/uL (ref 0–0.1)
Basophils Relative: 0 %
EOS ABS: 0 10*3/uL (ref 0–0.7)
EOS PCT: 0 %
HCT: 39.2 % (ref 35.0–47.0)
Hemoglobin: 13 g/dL (ref 12.0–16.0)
LYMPHS ABS: 0.7 10*3/uL — AB (ref 1.0–3.6)
Lymphocytes Relative: 10 %
MCH: 31 pg (ref 26.0–34.0)
MCHC: 33.2 g/dL (ref 32.0–36.0)
MCV: 93.5 fL (ref 80.0–100.0)
Monocytes Absolute: 0.2 10*3/uL (ref 0.2–0.9)
Monocytes Relative: 2 %
Neutro Abs: 6.3 10*3/uL (ref 1.4–6.5)
Neutrophils Relative %: 88 %
PLATELETS: 229 10*3/uL (ref 150–440)
RBC: 4.19 MIL/uL (ref 3.80–5.20)
RDW: 15.3 % — ABNORMAL HIGH (ref 11.5–14.5)
WBC: 7.2 10*3/uL (ref 3.6–11.0)

## 2015-06-07 LAB — COMPREHENSIVE METABOLIC PANEL
ALT: 17 U/L (ref 14–54)
ANION GAP: 11 (ref 5–15)
AST: 30 U/L (ref 15–41)
Albumin: 3.9 g/dL (ref 3.5–5.0)
Alkaline Phosphatase: 96 U/L (ref 38–126)
BUN: 51 mg/dL — ABNORMAL HIGH (ref 6–20)
CHLORIDE: 98 mmol/L — AB (ref 101–111)
CO2: 27 mmol/L (ref 22–32)
Calcium: 9.4 mg/dL (ref 8.9–10.3)
Creatinine, Ser: 1.31 mg/dL — ABNORMAL HIGH (ref 0.44–1.00)
GFR calc non Af Amer: 34 mL/min — ABNORMAL LOW (ref >60)
GFR, EST AFRICAN AMERICAN: 39 mL/min — AB (ref >60)
Glucose, Bld: 221 mg/dL — ABNORMAL HIGH (ref 65–99)
POTASSIUM: 5.9 mmol/L — AB (ref 3.5–5.1)
SODIUM: 136 mmol/L (ref 135–145)
Total Bilirubin: 0.8 mg/dL (ref 0.3–1.2)
Total Protein: 7 g/dL (ref 6.5–8.1)

## 2015-06-07 LAB — PROTIME-INR
INR: 1.62
Prothrombin Time: 19.3 seconds — ABNORMAL HIGH (ref 11.4–15.0)

## 2015-06-07 LAB — TSH: TSH: 1.106 u[IU]/mL (ref 0.350–4.500)

## 2015-06-07 LAB — MAGNESIUM: Magnesium: 2 mg/dL (ref 1.7–2.4)

## 2015-06-07 LAB — VITAMIN B12: Vitamin B-12: 407 pg/mL (ref 180–914)

## 2015-06-08 LAB — VITAMIN D 25 HYDROXY (VIT D DEFICIENCY, FRACTURES): Vit D, 25-Hydroxy: 27.1 ng/mL — ABNORMAL LOW (ref 30.0–100.0)

## 2015-06-14 DIAGNOSIS — I4891 Unspecified atrial fibrillation: Secondary | ICD-10-CM | POA: Diagnosis not present

## 2015-06-14 LAB — PROTIME-INR
INR: 1.68
PROTHROMBIN TIME: 19.8 s — AB (ref 11.4–15.0)

## 2015-06-14 IMAGING — CR DG CHEST 2V
1 series · 2 of 2 positions shown · non-contrast
Comparison: None.

CLINICAL DATA: Shortness of breath.  Chronic cough.

EXAM:
CHEST  2 VIEW

[Series 1: x chest ap · 0.14mm/px · 2 of 2 slices shown]
[im 1/2]
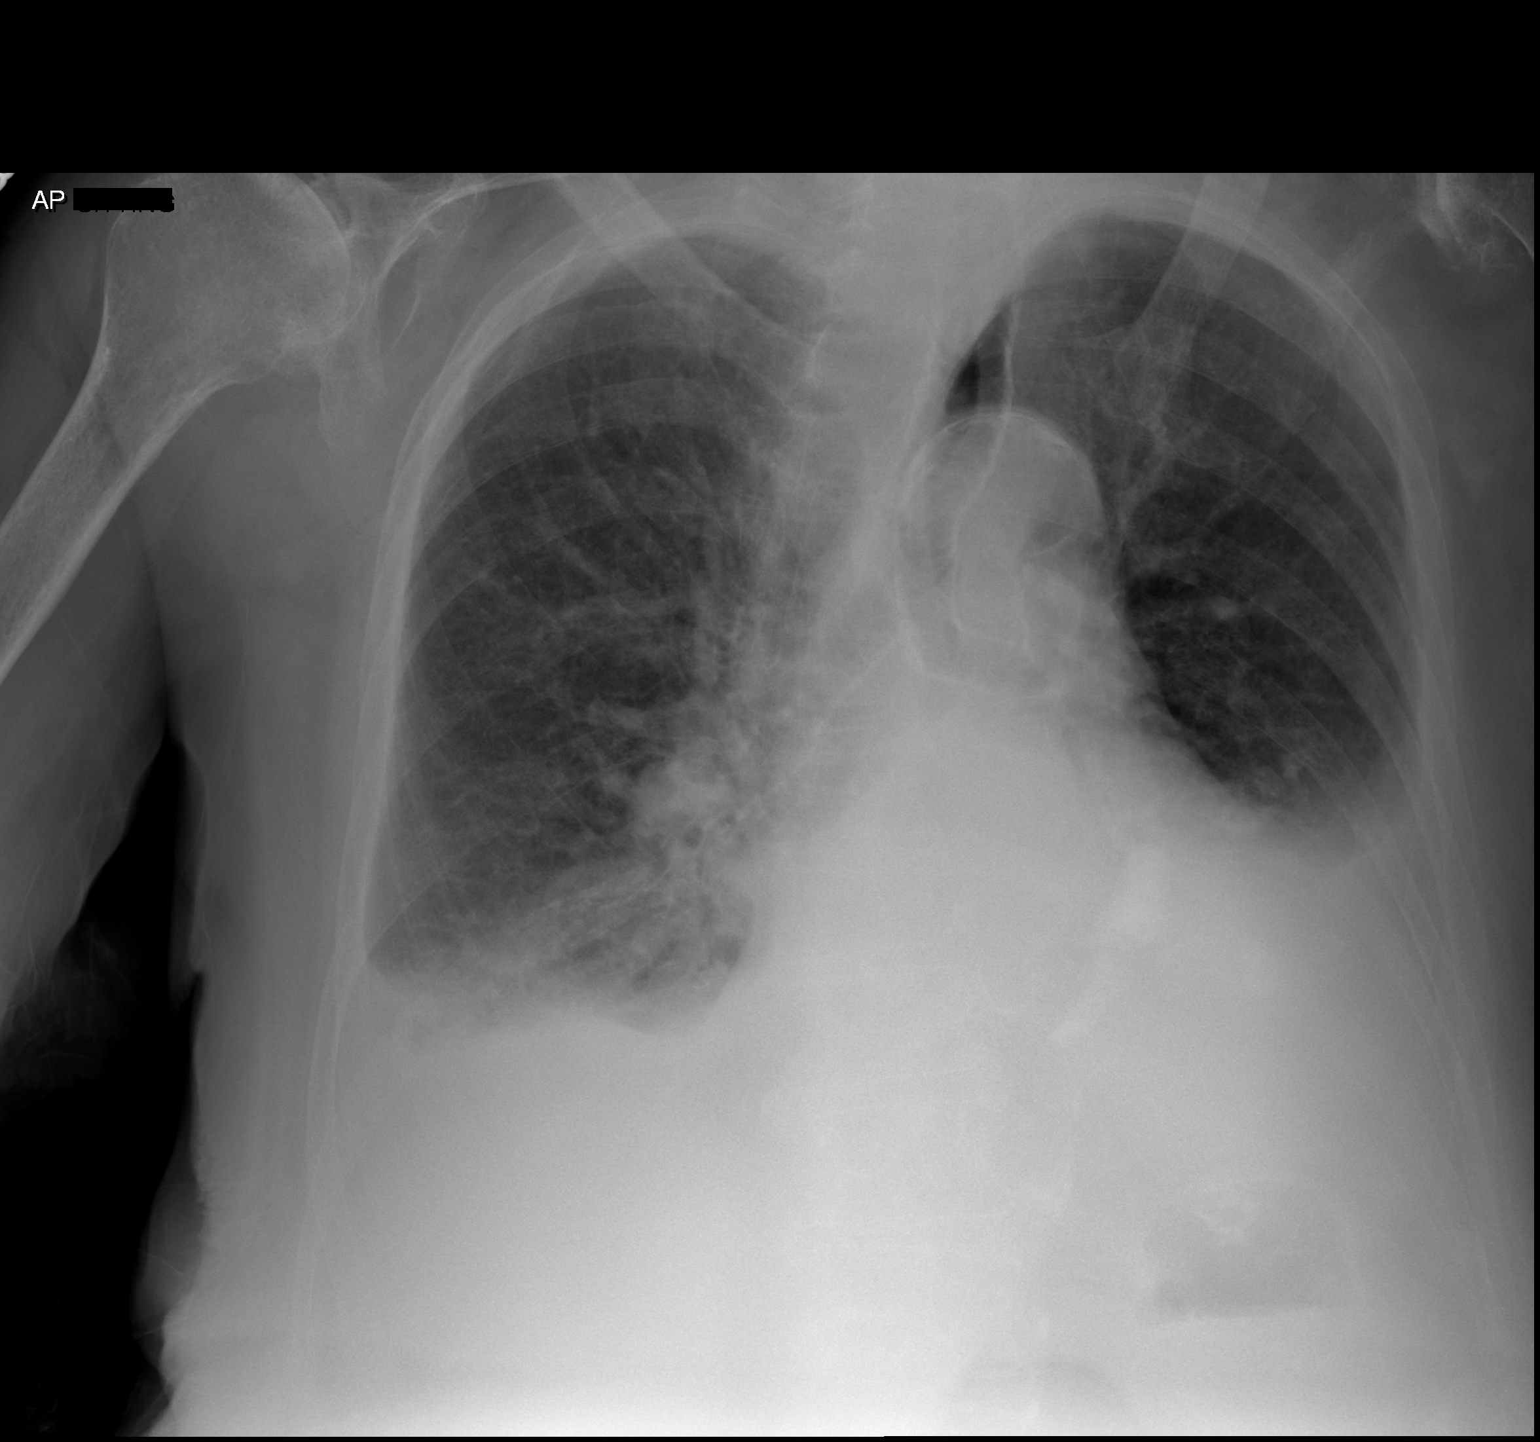
[im 2/2]
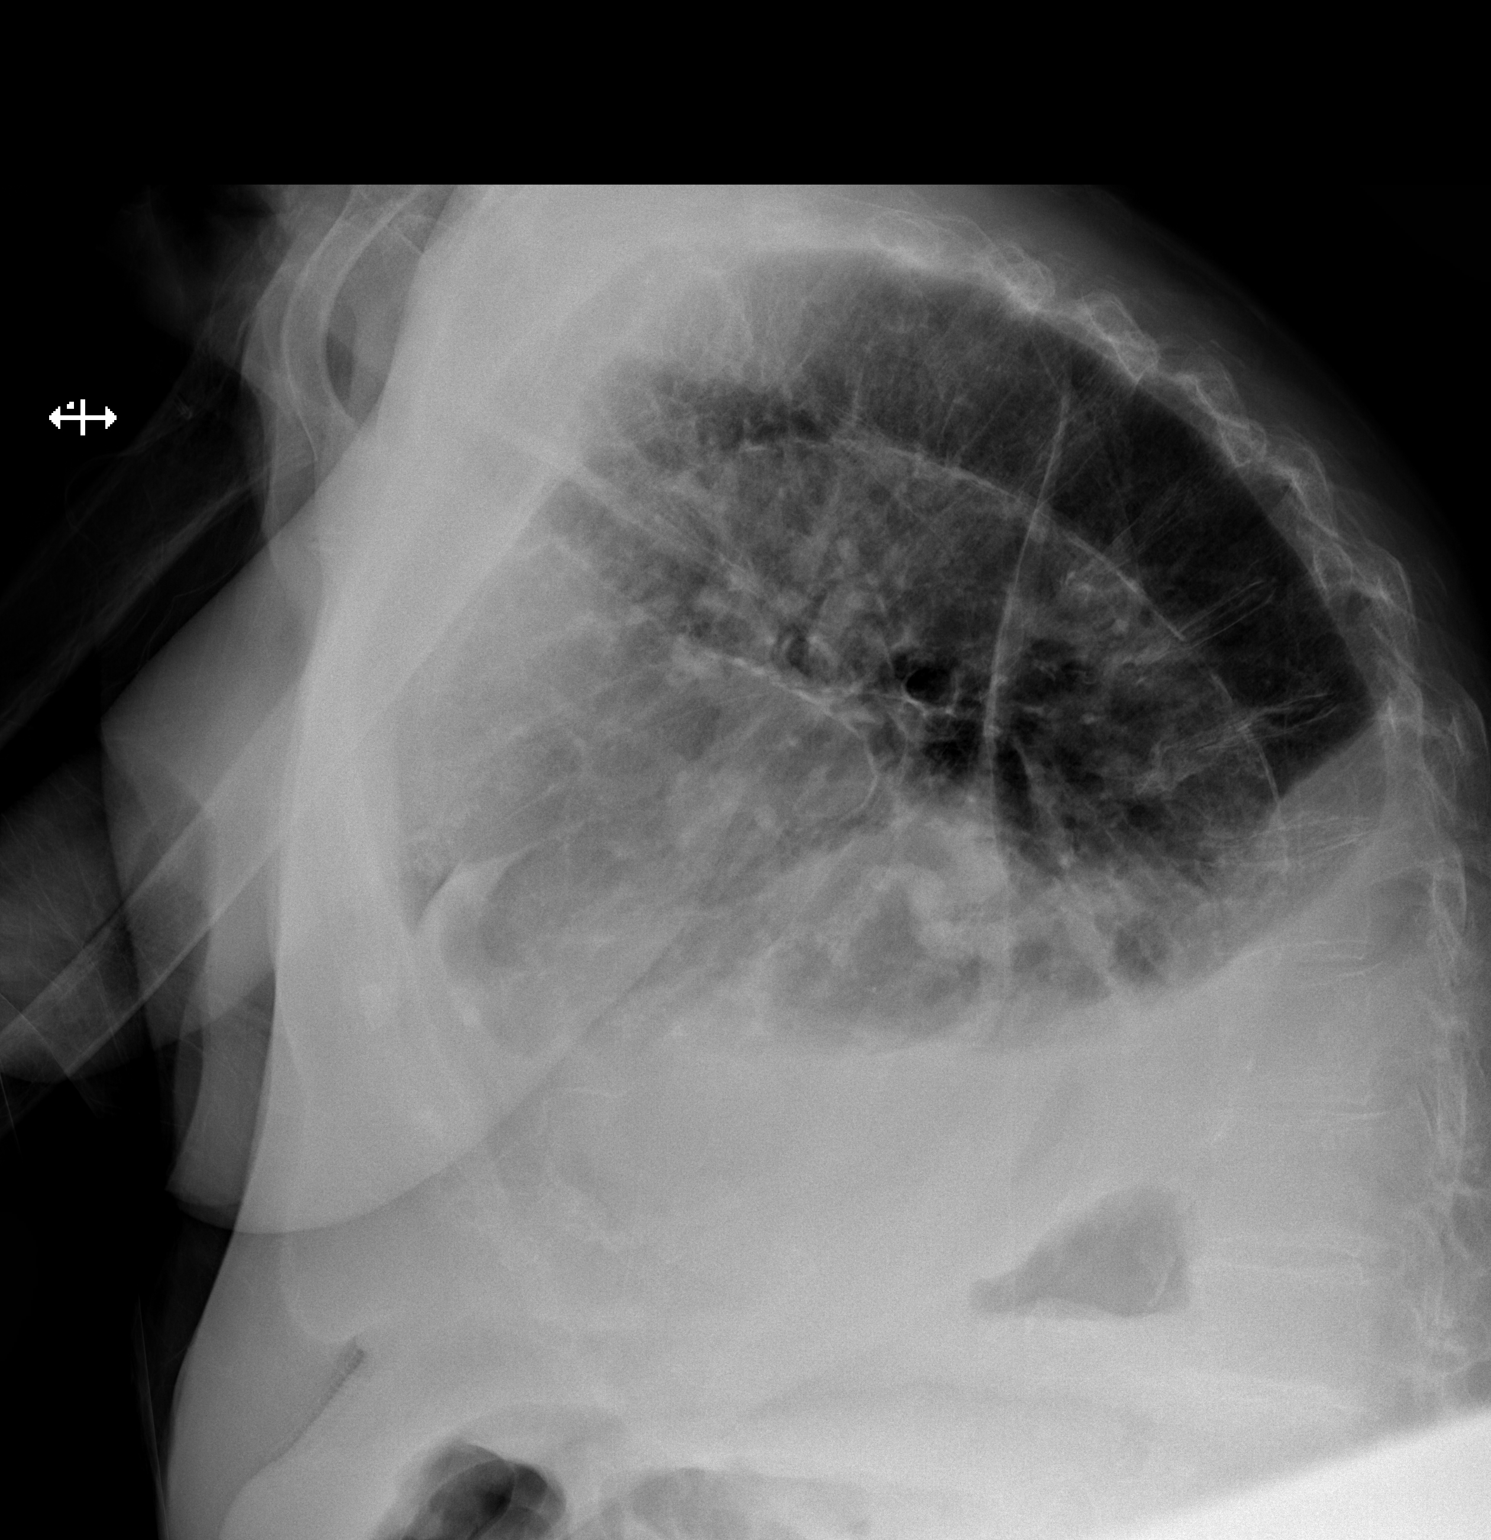

[2 of 2 positions shown; findings below may reference images not displayed]

FINDINGS: There is a moderate right pleural effusion and large left pleural
effusion. There is tortuosity and calcification of the thoracic
aorta as well as extensive calcification in the mitral valve
annulus. There is slight pulmonary vascular congestion. There is
marked accentuation of the thoracic kyphosis with severe
osteoarthritis of both glenohumeral joints.
IMPRESSION: Pulmonary vascular congestion with bilateral pleural effusions, left
greater than right.

## 2015-06-21 DIAGNOSIS — I4891 Unspecified atrial fibrillation: Secondary | ICD-10-CM | POA: Diagnosis not present

## 2015-06-21 LAB — PROTIME-INR
INR: 5.62 — AB
PROTHROMBIN TIME: 49.1 s — AB (ref 11.4–15.0)

## 2015-06-22 DIAGNOSIS — I4891 Unspecified atrial fibrillation: Secondary | ICD-10-CM | POA: Diagnosis not present

## 2015-06-22 LAB — PROTIME-INR
INR: 2.12
PROTHROMBIN TIME: 23.6 s — AB (ref 11.4–15.0)

## 2015-06-27 DIAGNOSIS — I4891 Unspecified atrial fibrillation: Secondary | ICD-10-CM | POA: Diagnosis not present

## 2015-06-27 LAB — PROTIME-INR
INR: 0.96
PROTHROMBIN TIME: 13 s (ref 11.4–15.0)

## 2015-07-02 ENCOUNTER — Encounter
Admission: RE | Admit: 2015-07-02 | Discharge: 2015-07-02 | Disposition: A | Discharge status: Home / Self Care | Payer: Medicare Other | Source: Ambulatory Visit | Attending: Internal Medicine | Admitting: Internal Medicine

## 2015-07-02 DIAGNOSIS — E875 Hyperkalemia: Secondary | ICD-10-CM | POA: Insufficient documentation

## 2015-07-02 DIAGNOSIS — Z7901 Long term (current) use of anticoagulants: Secondary | ICD-10-CM | POA: Insufficient documentation

## 2015-07-05 DIAGNOSIS — E875 Hyperkalemia: Secondary | ICD-10-CM | POA: Diagnosis not present

## 2015-07-05 DIAGNOSIS — Z7901 Long term (current) use of anticoagulants: Secondary | ICD-10-CM | POA: Diagnosis present

## 2015-07-05 LAB — COMPREHENSIVE METABOLIC PANEL
ALK PHOS: 108 U/L (ref 38–126)
ALT: 17 U/L (ref 14–54)
ANION GAP: 10 (ref 5–15)
AST: 29 U/L (ref 15–41)
Albumin: 3.9 g/dL (ref 3.5–5.0)
BILIRUBIN TOTAL: 0.9 mg/dL (ref 0.3–1.2)
BUN: 71 mg/dL — ABNORMAL HIGH (ref 6–20)
CALCIUM: 9.4 mg/dL (ref 8.9–10.3)
CO2: 30 mmol/L (ref 22–32)
Chloride: 94 mmol/L — ABNORMAL LOW (ref 101–111)
Creatinine, Ser: 1.24 mg/dL — ABNORMAL HIGH (ref 0.44–1.00)
GFR calc Af Amer: 42 mL/min — ABNORMAL LOW (ref >60)
GFR, EST NON AFRICAN AMERICAN: 36 mL/min — AB (ref >60)
Glucose, Bld: 135 mg/dL — ABNORMAL HIGH (ref 65–99)
POTASSIUM: 3.5 mmol/L (ref 3.5–5.1)
Sodium: 134 mmol/L — ABNORMAL LOW (ref 135–145)
TOTAL PROTEIN: 6.4 g/dL — AB (ref 6.5–8.1)

## 2015-07-07 DIAGNOSIS — E875 Hyperkalemia: Secondary | ICD-10-CM | POA: Diagnosis not present

## 2015-07-07 LAB — CBC WITH DIFFERENTIAL/PLATELET
BASOS ABS: 0 10*3/uL (ref 0–0.1)
BASOS PCT: 0 %
Eosinophils Absolute: 0.2 10*3/uL (ref 0–0.7)
Eosinophils Relative: 2 %
HEMATOCRIT: 37.5 % (ref 35.0–47.0)
HEMOGLOBIN: 12.9 g/dL (ref 12.0–16.0)
LYMPHS PCT: 28 %
Lymphs Abs: 2.3 10*3/uL (ref 1.0–3.6)
MCH: 31.8 pg (ref 26.0–34.0)
MCHC: 34.3 g/dL (ref 32.0–36.0)
MCV: 92.8 fL (ref 80.0–100.0)
Monocytes Absolute: 0.7 10*3/uL (ref 0.2–0.9)
Monocytes Relative: 8 %
NEUTROS ABS: 5.1 10*3/uL (ref 1.4–6.5)
NEUTROS PCT: 62 %
Platelets: 195 10*3/uL (ref 150–440)
RBC: 4.05 MIL/uL (ref 3.80–5.20)
RDW: 15.5 % — AB (ref 11.5–14.5)
WBC: 8.3 10*3/uL (ref 3.6–11.0)

## 2015-08-01 ENCOUNTER — Encounter
Admission: RE | Admit: 2015-08-01 | Discharge: 2015-08-01 | Disposition: A | Discharge status: Home / Self Care | Payer: Medicare Other | Source: Ambulatory Visit | Attending: Internal Medicine | Admitting: Internal Medicine

## 2015-09-01 ENCOUNTER — Encounter
Admission: RE | Admit: 2015-09-01 | Discharge: 2015-09-01 | Disposition: A | Discharge status: Home / Self Care | Payer: Medicare Other | Source: Ambulatory Visit | Attending: Internal Medicine | Admitting: Internal Medicine

## 2015-09-09 ENCOUNTER — Non-Acute Institutional Stay (SKILLED_NURSING_FACILITY): Payer: Medicare Other | Admitting: Gerontology

## 2015-09-09 DIAGNOSIS — J9620 Acute and chronic respiratory failure, unspecified whether with hypoxia or hypercapnia: Secondary | ICD-10-CM | POA: Diagnosis not present

## 2015-09-09 DIAGNOSIS — I350 Nonrheumatic aortic (valve) stenosis: Secondary | ICD-10-CM | POA: Insufficient documentation

## 2015-09-09 NOTE — Progress Notes (Signed)
Location:   ([facility])   Place of Service:  ALF [phone]) Provider:  Toni Arthurs, NP-C  Dr Frazier Richards, MD   Extended Emergency Contact Information Primary Emergency Contact: [facility] Address: [address]          [place], [place] [address] Johnnette Litter of [place] Phone: [phone] Relation: Son Secondary Emergency Contact: Lopezgarcia,Richard Address: [address]          [place], [place] [address] Johnnette Litter of [place] Phone: [phone] Work Phone: [phone] Mobile Phone: [phone] Relation: Spouse  Code Status: DNR Goals of care: Advanced Directive information No flowsheet data found.   Chief Complaint  Patient presents with  . Respiratory Distress    HPI:  Pt is a 82 y.o. female seen today for an acute visit for Respiratory Distress. Pt's health has had a slow decline over the past year since her husband's death, but was in her usual state of health until this morning. Last evening, staff was placing pt back in bed with mechanical lift. She was seated on the side of bed. While staff was unhooking the sling, pt started to slide to the floor. Staff did catch her; she did not hit her head, but knee hit the floor and abrasion of her back. VSS at that point. She assured staff she "was fine." This am around mid-morning, pt developed tachypnea, desaturation and crackles. Nursing called for assessment.    Past medical history significant for Aortic Stenosis, A-fib, Heart failure, hypothyroidism, anxiety/ depression and likely COPD.  No past surgical history on file.      Medication List    Notice  As of 09/07/2015  4:23 PM   You have not been prescribed any medications.    See [facility] list  Review of Systems  Unable to perform ROS: Severe respiratory distress (ROS limited)  Constitutional: Positive for activity change and fatigue.  HENT: Negative.   Eyes: Negative.   Respiratory: Positive for shortness of breath and  wheezing (per staff). Negative for cough and chest tightness.   Cardiovascular: Negative for chest pain and leg swelling.  Gastrointestinal: Negative.   Genitourinary: Negative.   Musculoskeletal: Negative.   Skin: Positive for pallor.  Neurological: Positive for speech difficulty (very quiet volume, slurred speech, delayed response) and weakness. Negative for dizziness, facial asymmetry and headaches.     There is no immunization history on file for this patient. There are no preventive care reminders to display for this patient. No flowsheet data found. Functional Status Survey:    Filed Vitals:   09/07/2015 1614  BP: 106/56  Pulse: 102  Temp: 98.7 F (37.1 C)  Resp: 40  SpO2: 86%   There is no height or weight on file to calculate BMI. Physical Exam  Constitutional: She is oriented to person, place, and time. She appears well-developed and well-nourished. She appears lethargic. She appears ill. She appears distressed.  Eyes: Pupils are equal, round, and reactive to light.  Neck: No JVD present.  Cardiovascular: An irregular rhythm present. Tachycardia present.  Exam reveals decreased pulses.   Pulmonary/Chest: Accessory muscle usage present. No stridor. Tachypnea noted. She is in respiratory distress. She has decreased breath sounds in the right middle field, the right lower field and the left upper field. She has rales in the right upper field, the left upper field and the left lower field.  Abdominal: Normal appearance. Bowel sounds are absent. There is no tenderness.  Neurological: She is oriented to person, place, and  time. She appears lethargic.  Skin: Skin is warm and dry. Ecchymosis (left knee) noted. There is pallor.  B-feet mottling and cool to the touch from toes to mid-shin.    Labs reviewed:  Recent Labs  11/16/14 0655 06/07/15 0935 07/05/15 0945  NA 142 136 134*  K 4.4 5.9* 3.5  CL 102 98* 94*  CO2 33* 27 30  GLUCOSE 82 221* 135*  BUN 35* 51* 71*    CREATININE 0.86 1.31* 1.24*  CALCIUM 8.9 9.4 9.4  MG  --  2.0  --     Recent Labs  11/09/14 0818 06/07/15 0935 07/05/15 0945  AST _0 ALT 11* 17 17  ALKPHOS 47 96 108  BILITOT 0.5 0.8 0.9  PROT 5.7* 7.0 6.4*  ALBUMIN 3.2* 3.9 3.9    Recent Labs  06/07/15 0935 07/07/15 0706  WBC 7.2 8.3  NEUTROABS 6.3 5.1  HGB 13.0 12.9  HCT 39.2 37.5  MCV 93.5 92.8  PLT 229 195   Lab Results  Component Value Date   TSH 1.106 06/07/2015   No results found for: HGBA1C Lab Results  Component Value Date   CHOL 149 06/07/2015   HDL 44 06/07/2015   LDLCALC 90 06/07/2015   TRIG 74 06/07/2015   CHOLHDL 3.4 06/07/2015    Significant Diagnostic Results in last 30 days:  No results found.  Assessment/Plan Nonrheumatic aortic (valve) stenosis I35.0  Respiratory Failure, Acute on Chronic, unspecified J96.2  At this time, the patient appears to be quickly approaching end of life. Hospice referral initiated  Morphine concentrate 20 mg/ ml- 0.25-0.5 mL po Q 1 hour prn pain/ dyspnea. First dose now  Increase O2- titrate to maintain sats >92%  Lasix 40 mg IM x 1 now  Duonebs Q 6 hrs prn, first now  Labs and xrays as listed (later cancelled)  Family/ staff Communication: Daughter in room, discussed End of Life- comfort care vs aggressive care. Daughter opted for comfort care. Daughter agreeable to Hospice services. Palliative Care NP here to see pt. Was seen yesterday and negative for these findings. Orders for xrays cancelled.   Labs/tests ordered:  CBC, Met C, UA, C&S, 2-view CXR, Left knee complete view xray  Pt was given 1 dose of morphine and respirations decreased. Pt appeared more relaxed/ less dyspneic. Hospice arrived and were unable to get pt enrolled d/t pt death. Daughter at bedside, grieving appropriately. Since pt had a fall last evening, ME on call notified. Dr Oswaldo Milian advised to have pt transported to [facility] morgue for examination and chart review.

## 2015-10-01 DEATH — deceased

## 2015-10-26 IMAGING — CR DG KNEE COMPLETE 4+V*L*
1 series · 4 of 4 positions shown · non-contrast
Comparison: None.

CLINICAL DATA: Initial encounter for pain and swelling on sat this
morning.

EXAM:
LEFT KNEE - COMPLETE 4+ VIEW

[Series 1: dxr knee lt comp with obliques · 0.14mm/px · 4 of 4 slices shown]
[im 1/4]
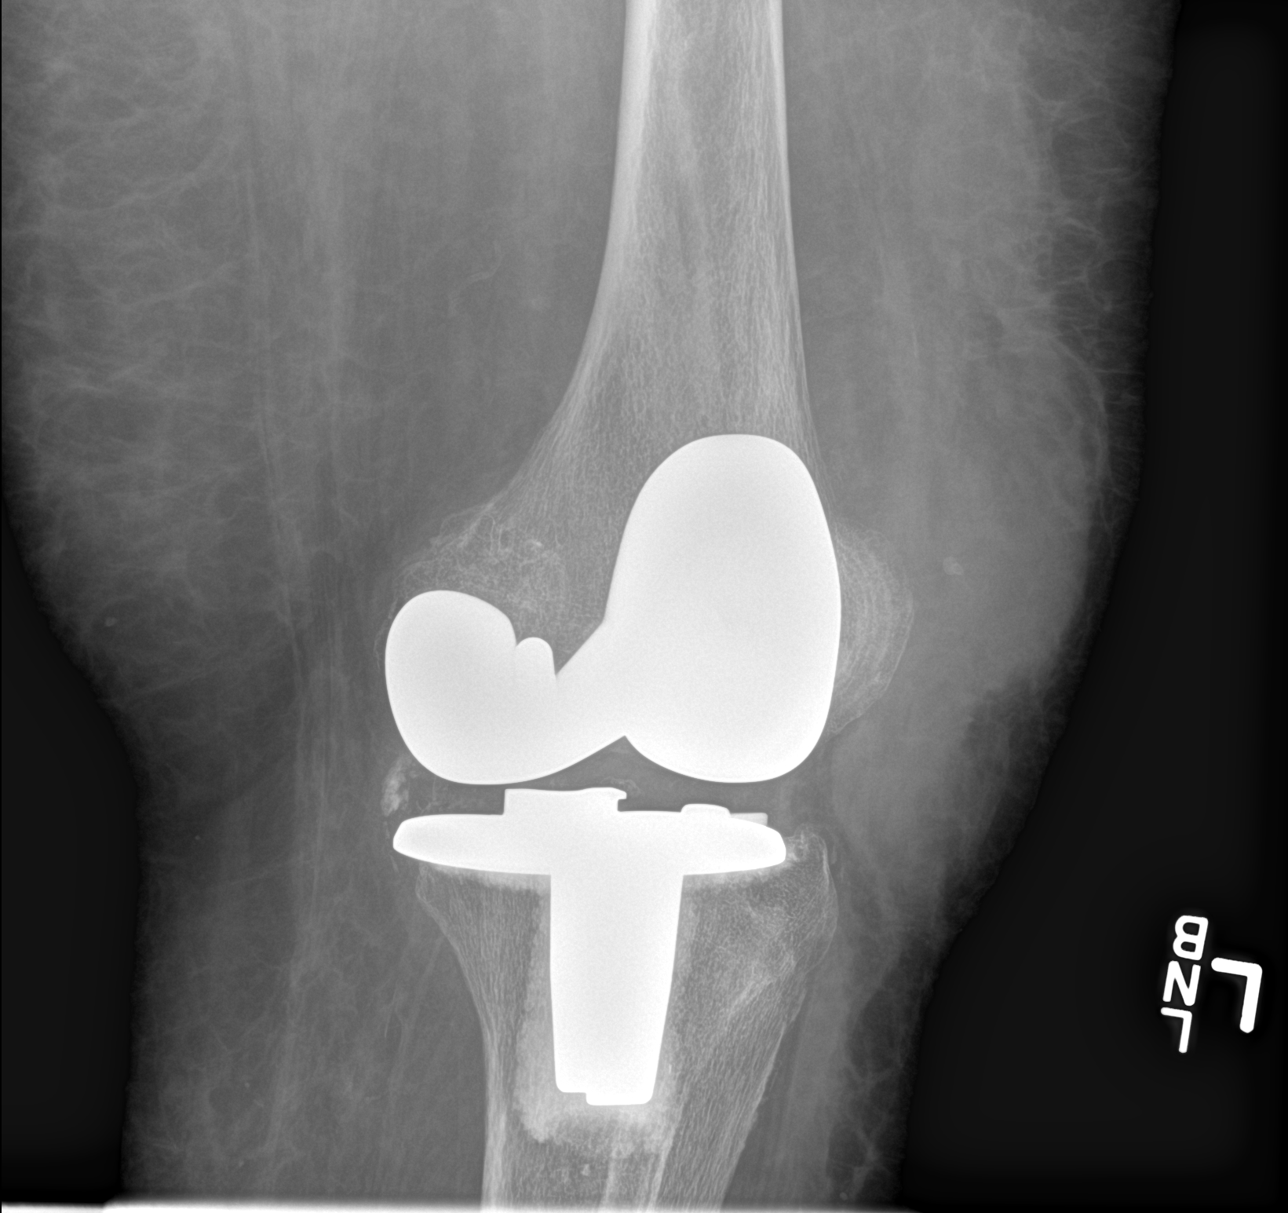
[im 2/4]
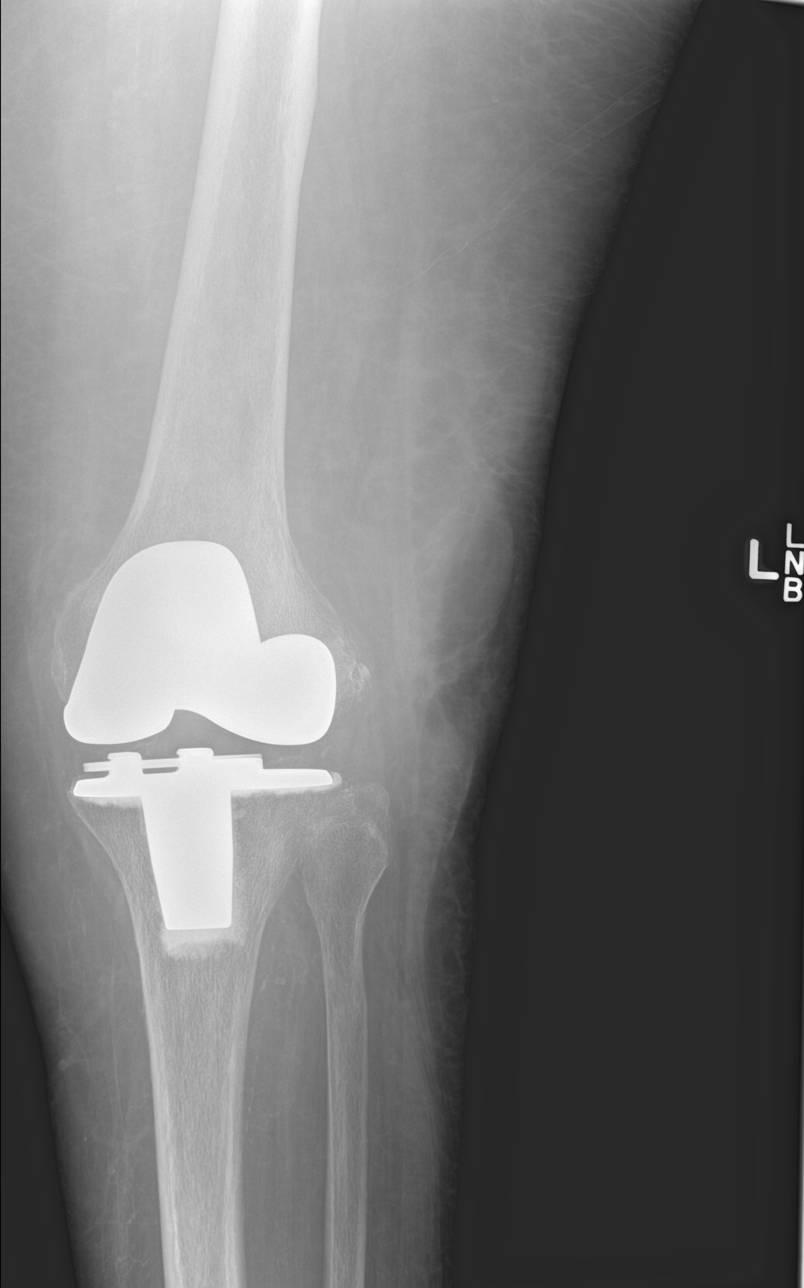
[im 3/4]
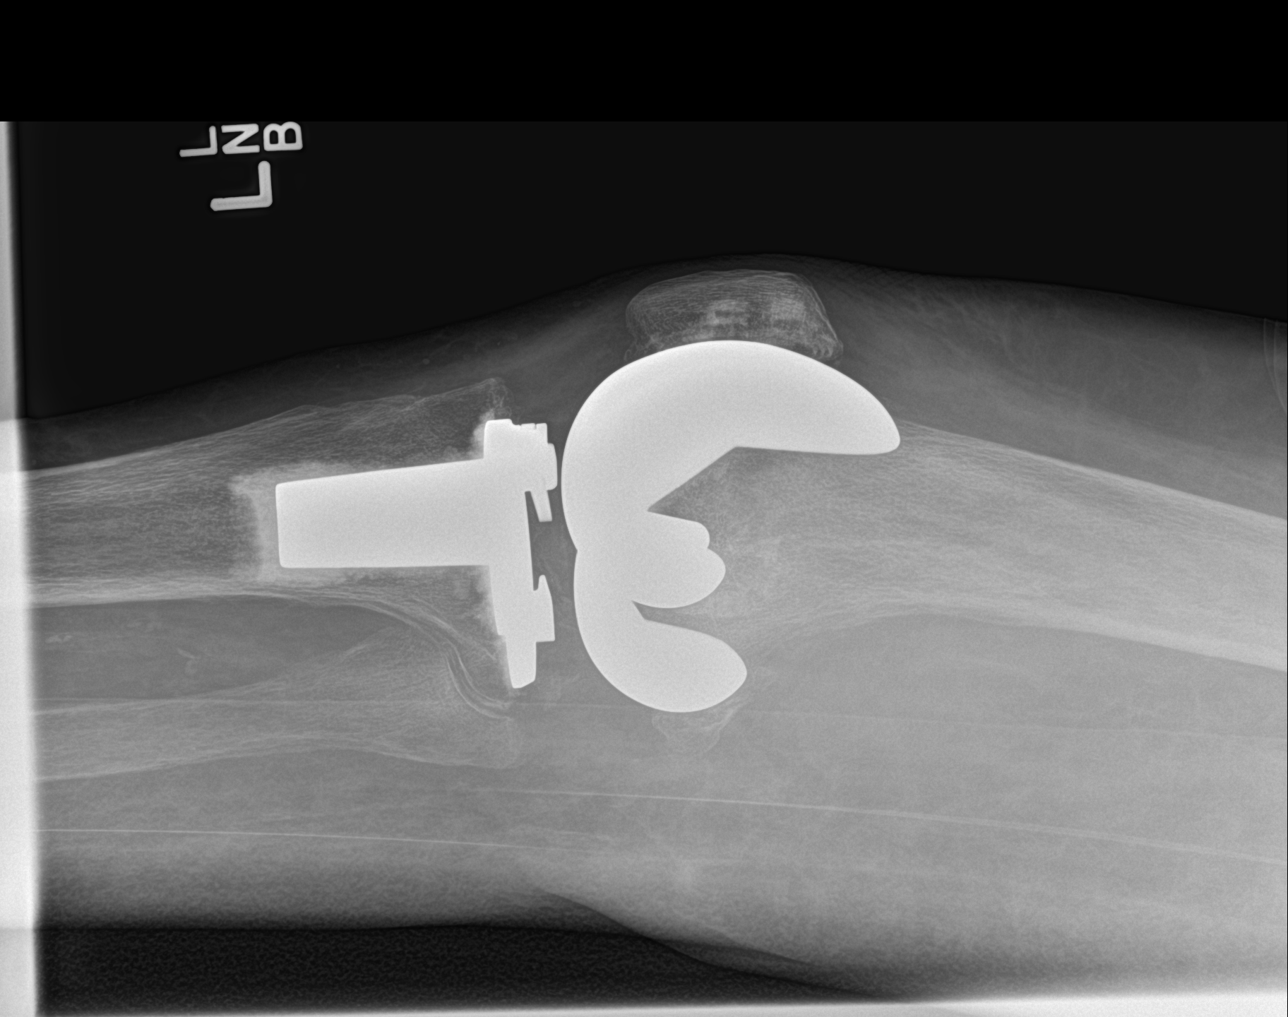
[im 4/4]
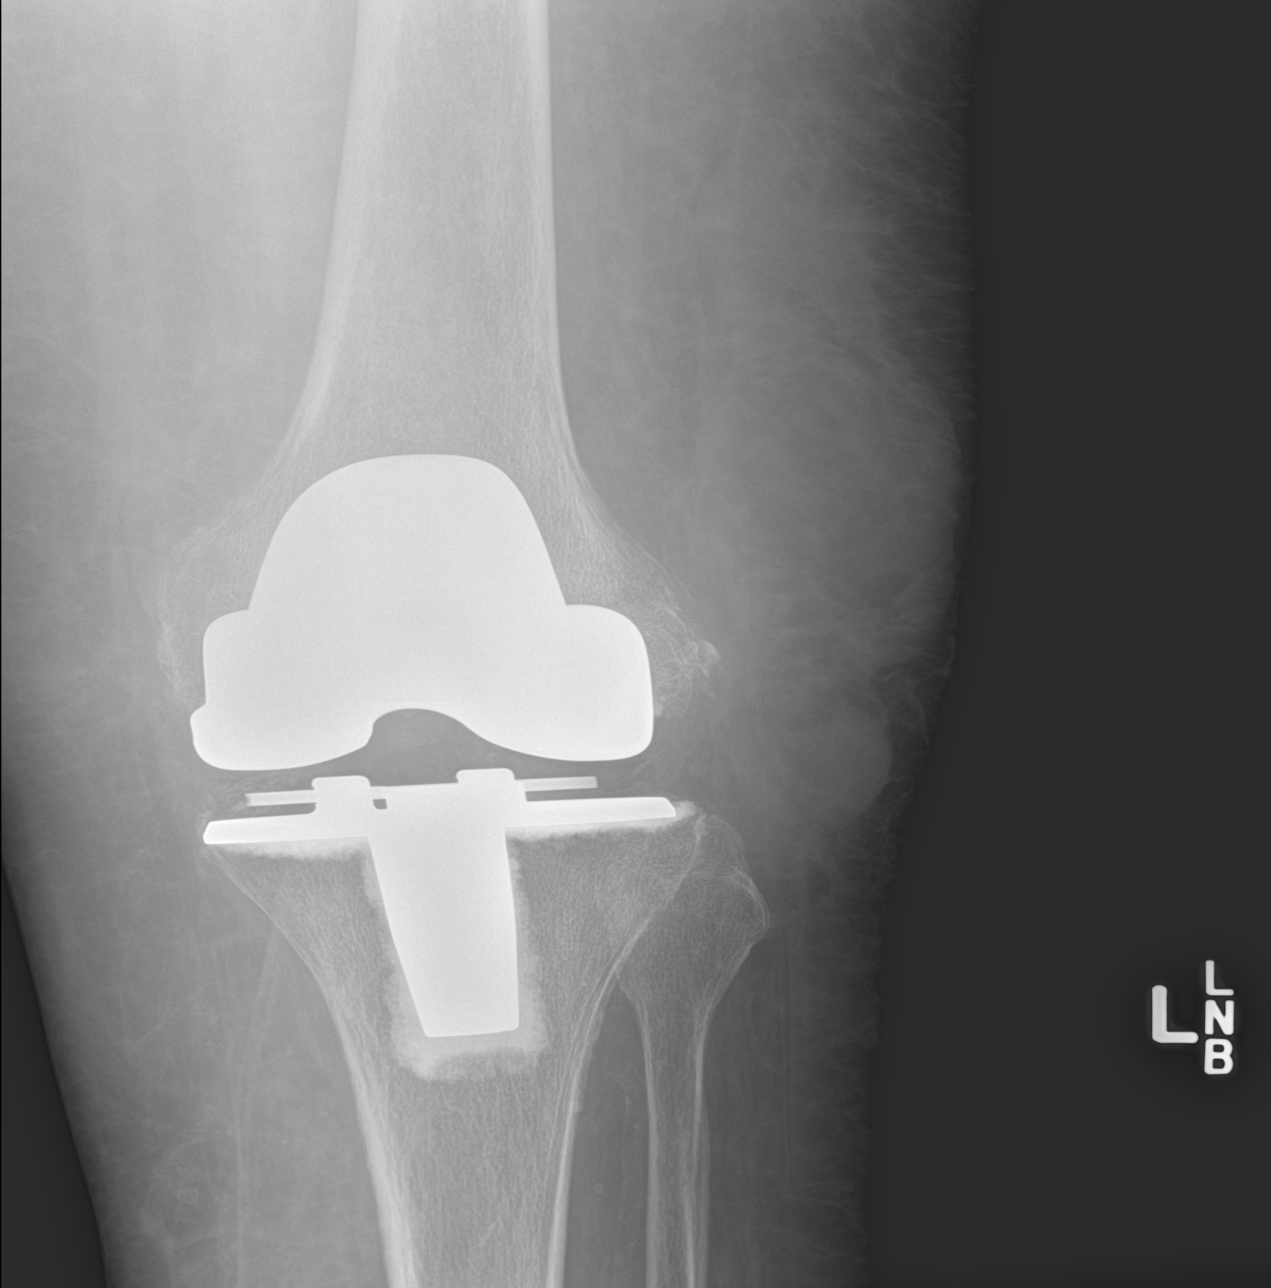

[4 of 4 positions shown; findings below may reference images not displayed]

FINDINGS: No evidence for fracture. No subluxation or dislocation. No definite
hardware complications. Bones are diffusely demineralized.
IMPRESSION: No evidence for acute bony abnormality.

## 2015-10-27 IMAGING — CR DG CHEST 1V PORT
1 series · 1 of 1 positions shown · non-contrast
Comparison: Chest radiograph from 08/27/2013

CLINICAL DATA: Acute onset of shortness of breath. Initial
encounter.

EXAM:
PORTABLE CHEST - 1 VIEW

[ap]
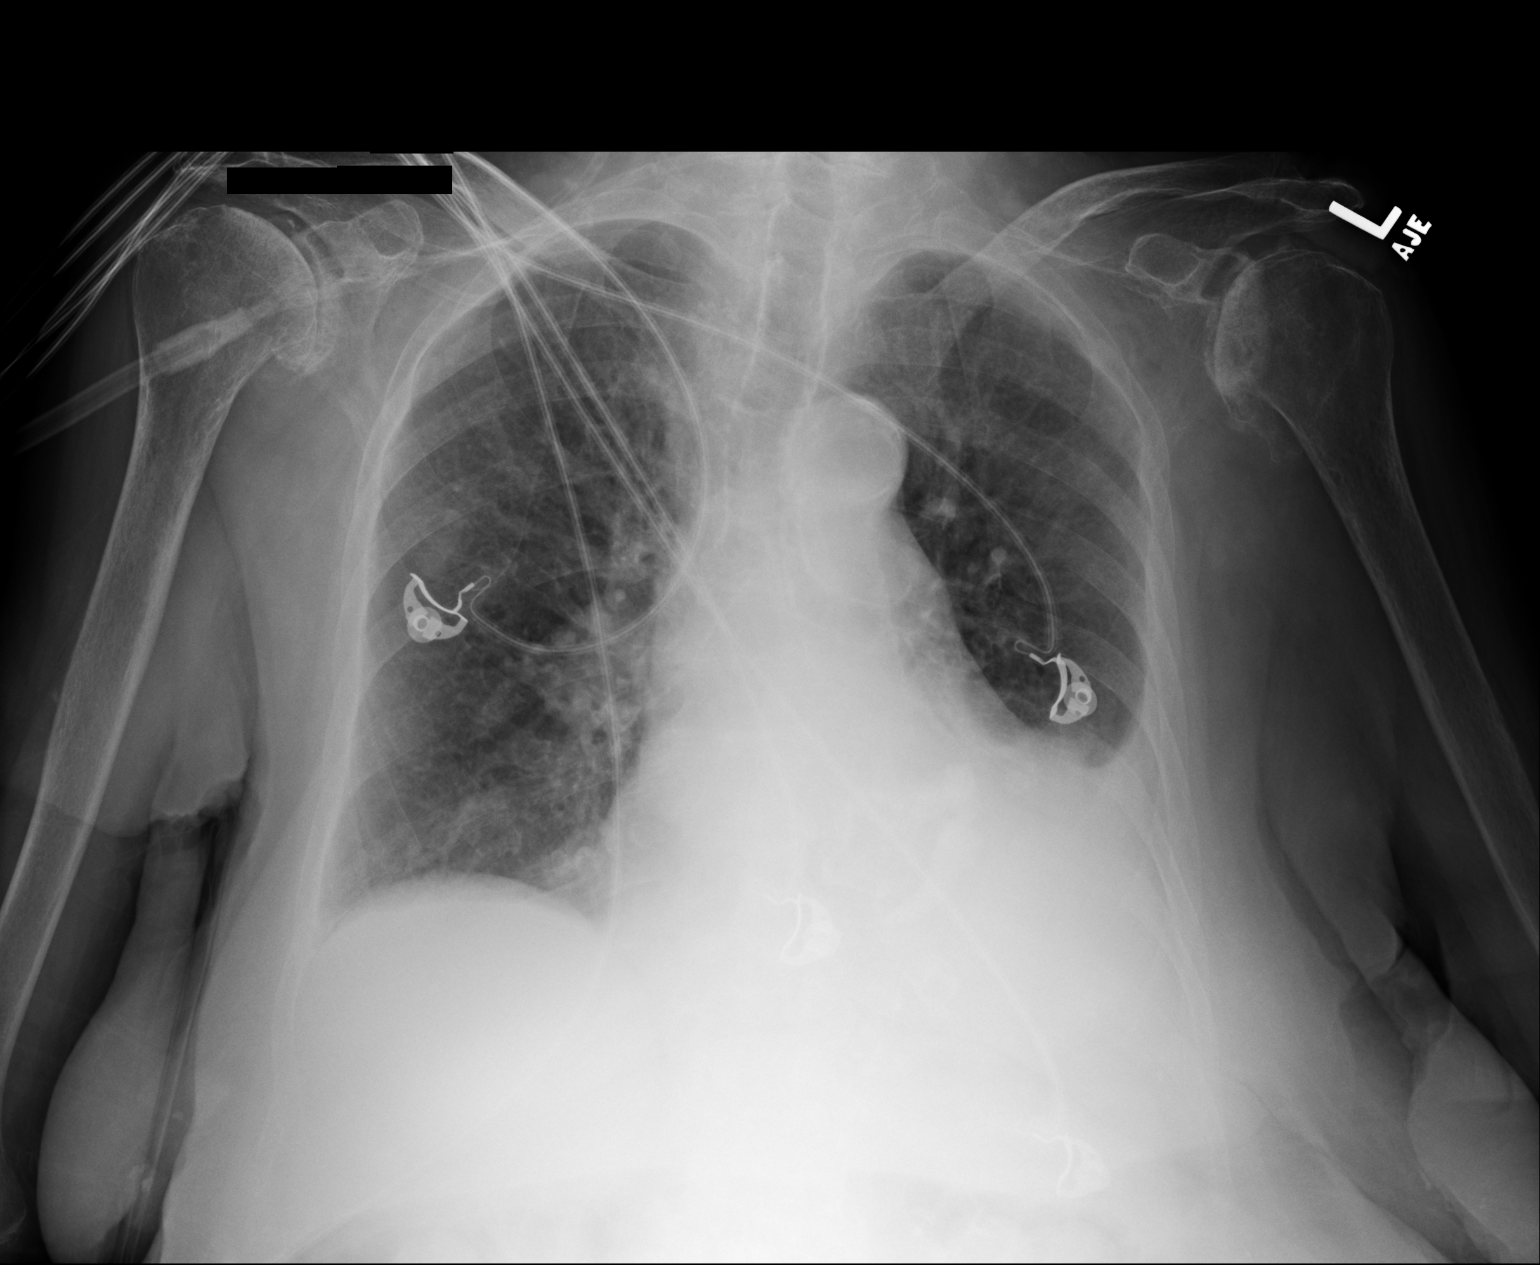

[1 of 1 positions shown; findings below may reference images not displayed]

FINDINGS: The lungs are well-aerated. A small to moderate left-sided pleural
effusion is again noted. The right-sided pleural effusion has
improved. Vascular congestion has also improved. No definite
pneumothorax is seen.

The cardiomediastinal silhouette remains mildly enlarged. No acute
osseous abnormalities are identified. Mild degenerative change is
noted at both glenohumeral joints.
IMPRESSION: 1. Persistent small to moderate left-sided pleural effusion;
right-sided pleural effusion has improved. Vascular congestion has
also improved.
2. Mild cardiomegaly.

## 2015-11-06 IMAGING — CR DG ABDOMEN 3V
1 series · 4 of 4 positions shown · non-contrast
Comparison: Lumbar radiographs 12/24/2012.

CLINICAL DATA: [AGE] female with vomiting rectal pain and
constipation. No relief after enema. Initial encounter.

EXAM:
ABDOMEN SERIES

[Series 1: dxr abdomen 3-way (incl pa cxr) · 0.14mm/px · 4 of 4 slices shown]
[im 1/4]
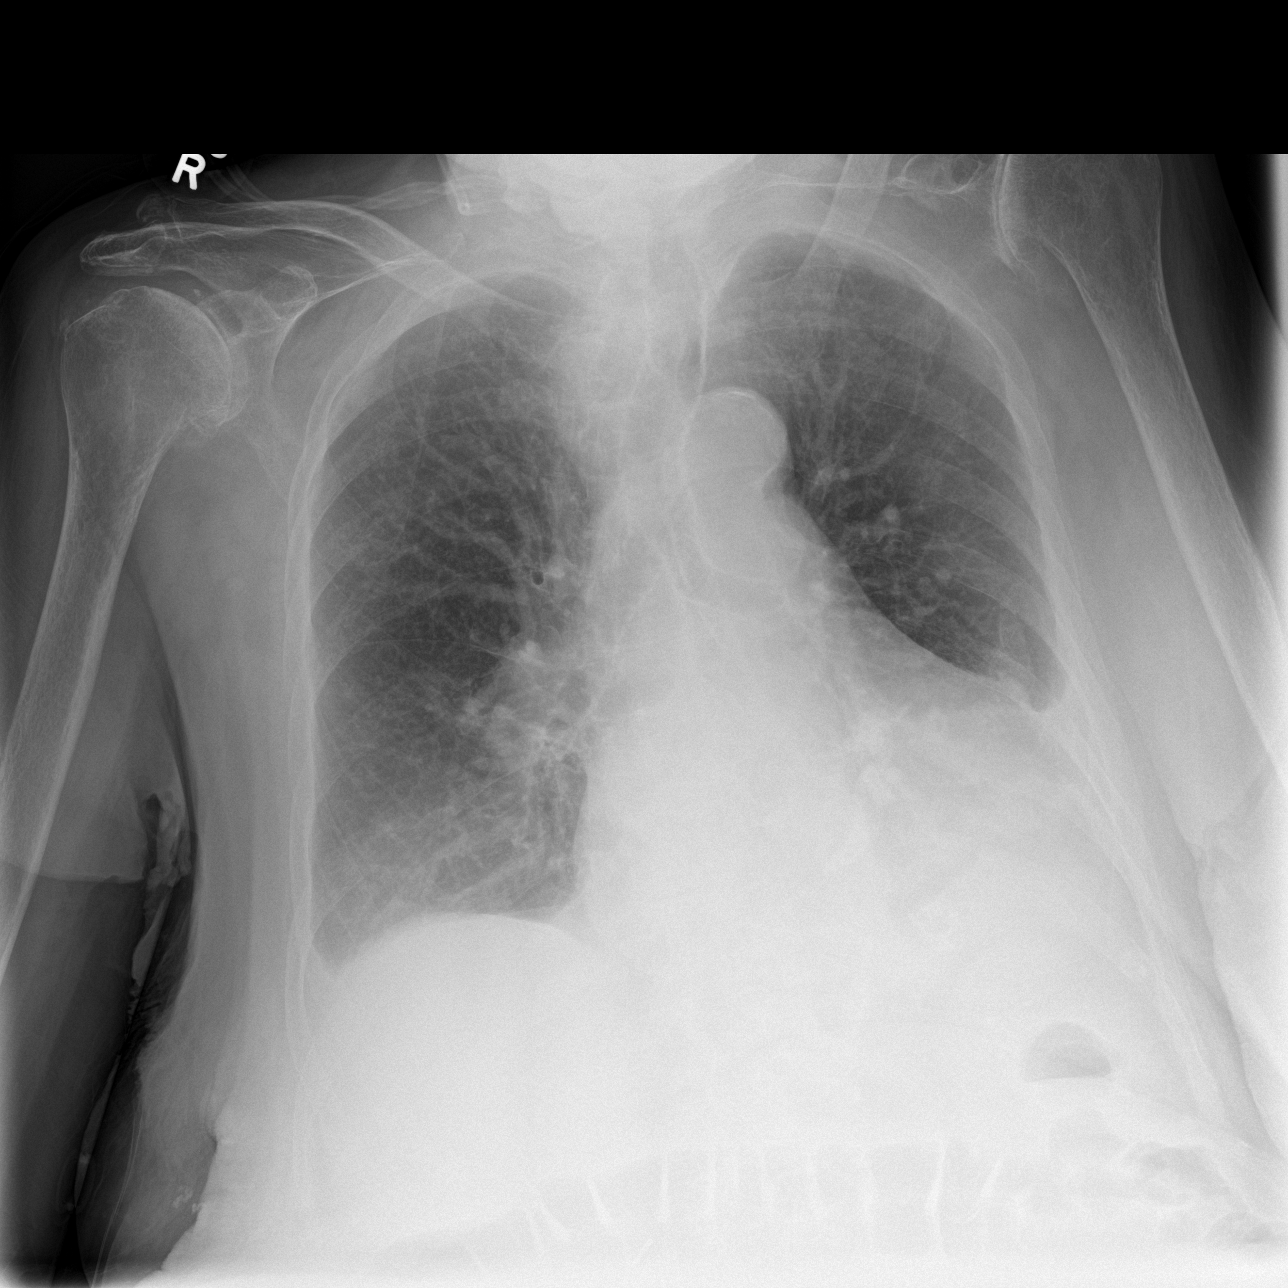
[im 2/4]
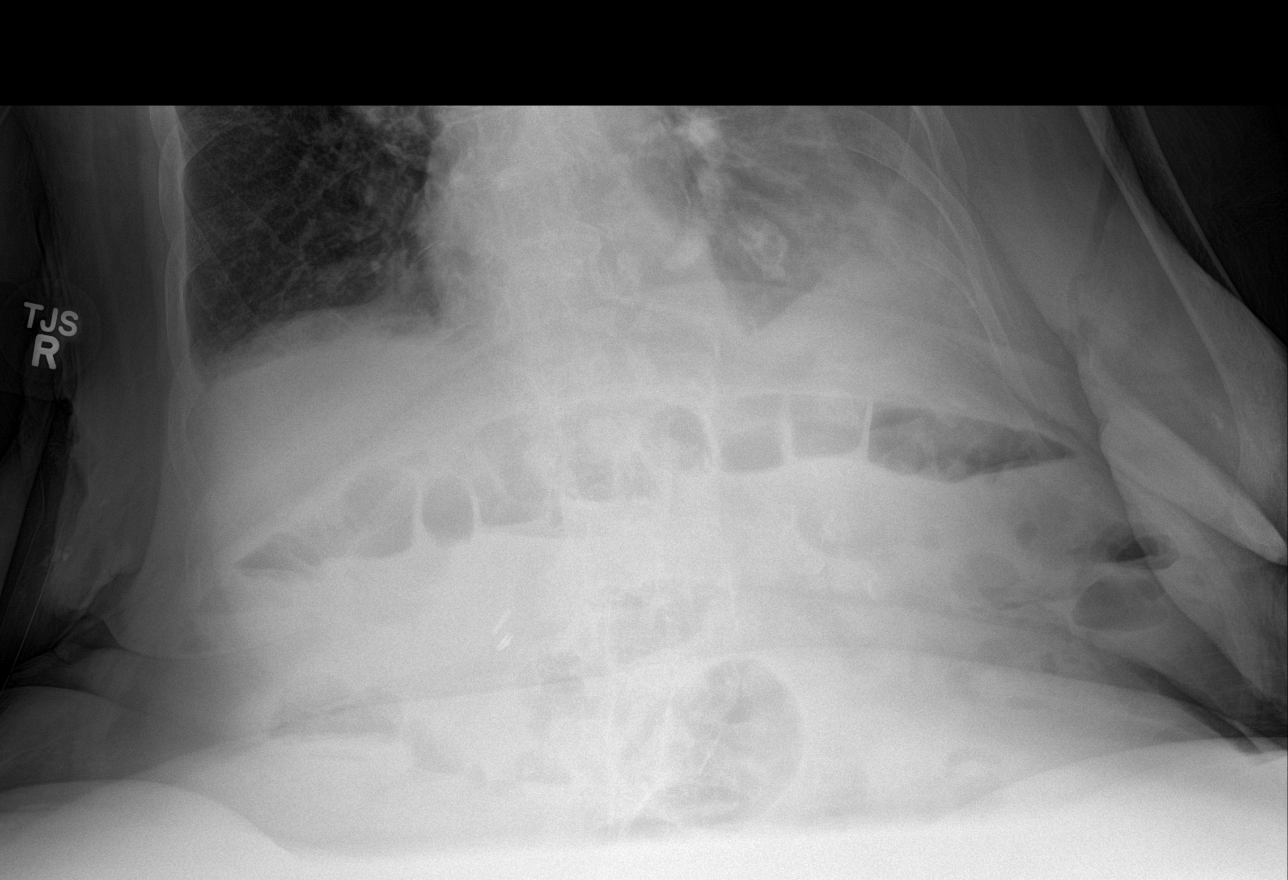
[im 3/4]
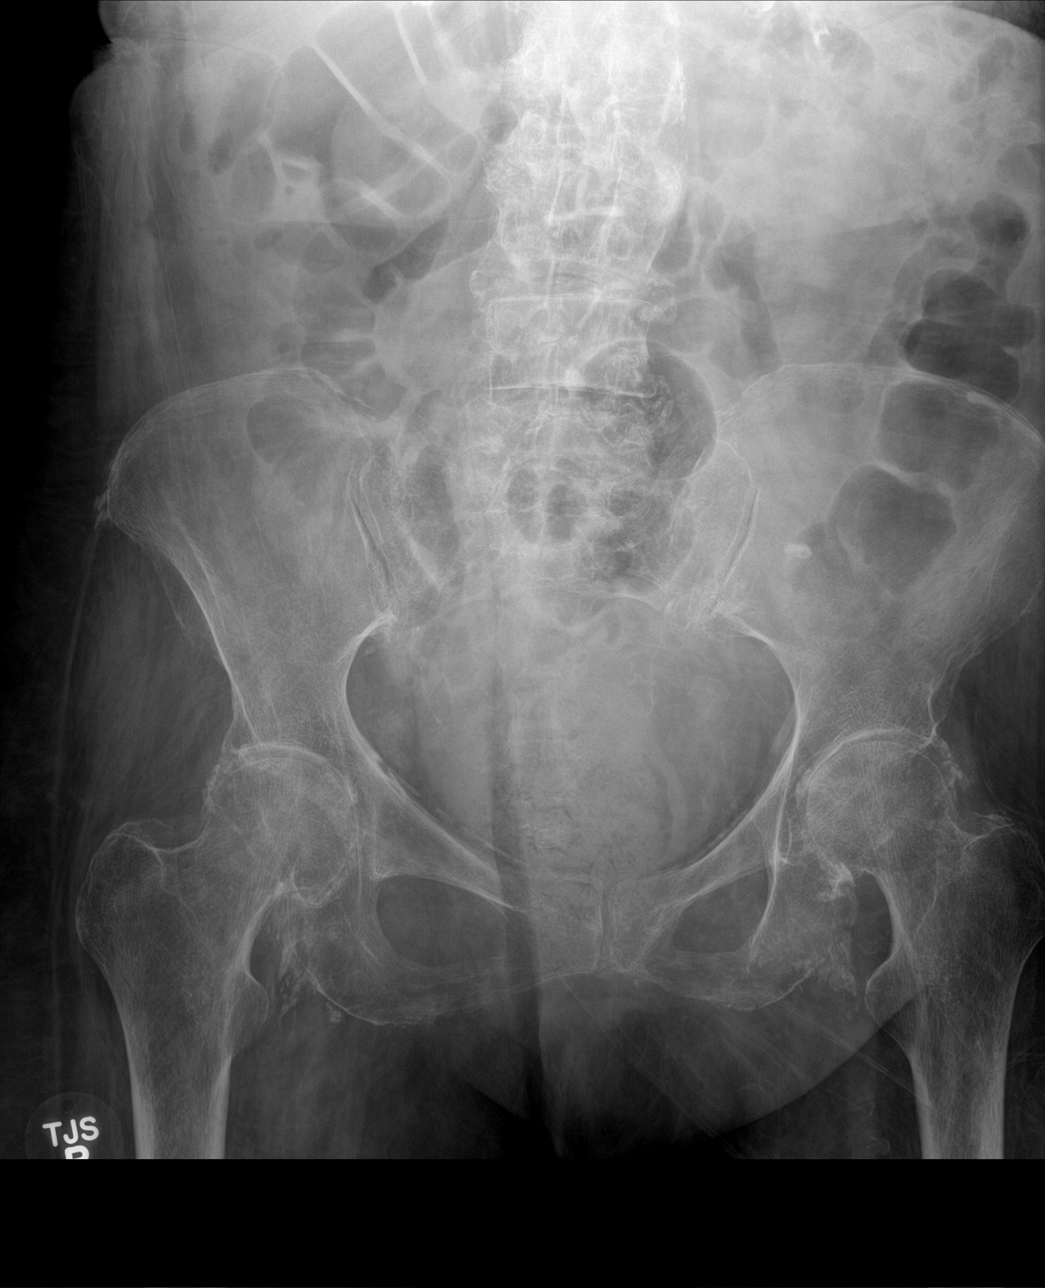
[im 4/4]
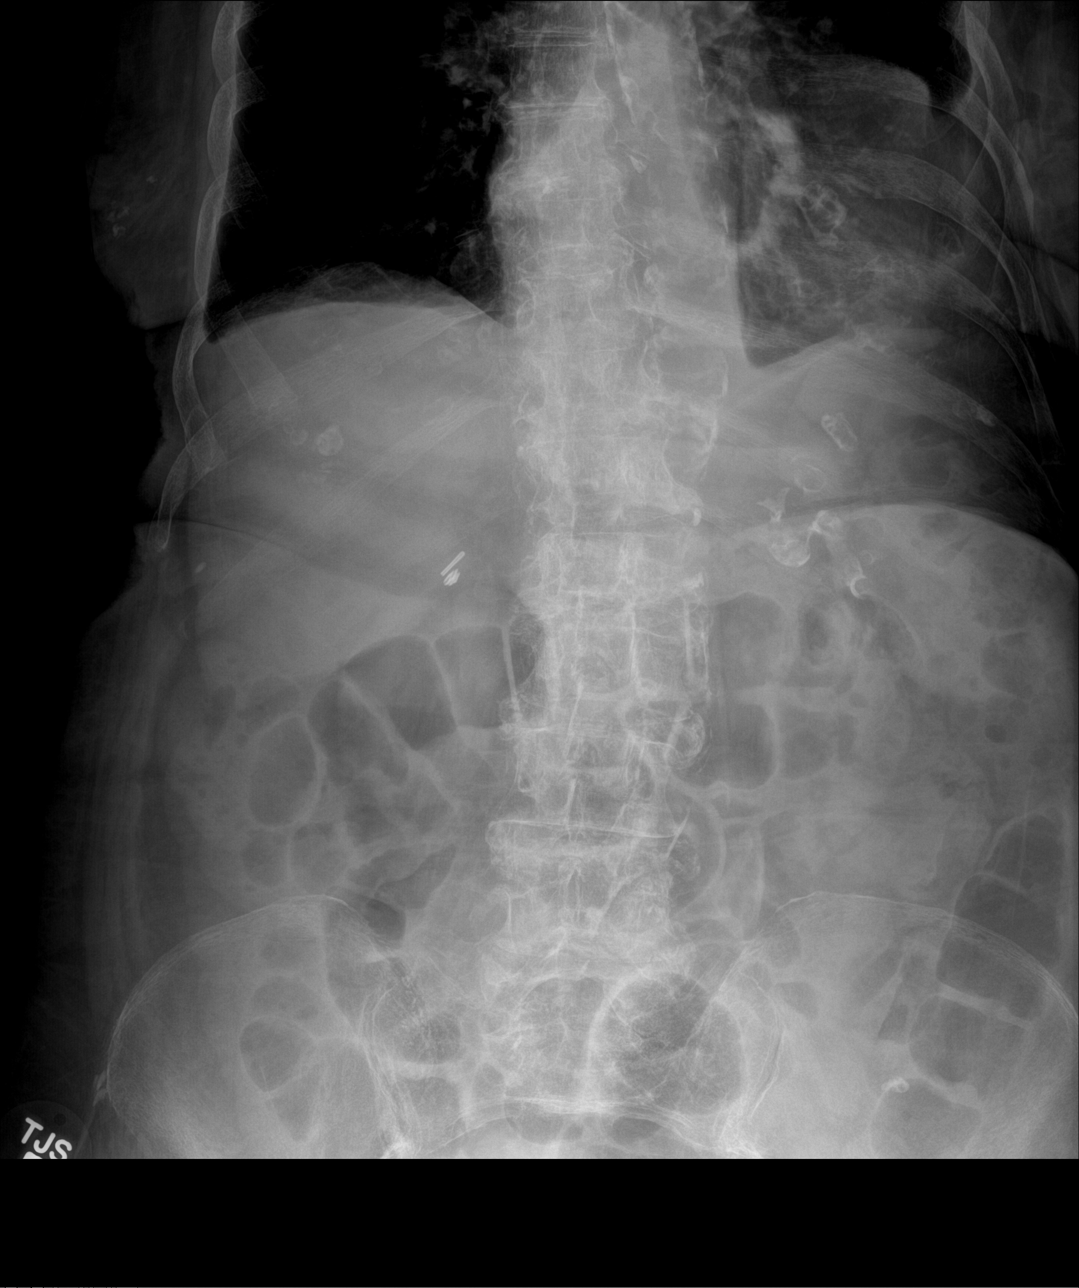

[4 of 4 positions shown; findings below may reference images not displayed]

FINDINGS: AP seated view of the chest. Cardiomegaly. Other mediastinal
contours are within normal limits. Small left greater than right
pleural effusions. No pneumothorax. No definite consolidation or
pulmonary edema.

Seated AP view of the abdomen. No pneumoperitoneum identified. Non
obstructed bowel gas pattern. There is retained stool in the pelvis
likely in the rectum, similar to the appearance in 1572. Aortoiliac
calcified atherosclerosis noted. Stable cholecystectomy clips.
Osteopenia. No acute osseous abnormality identified.
IMPRESSION: 1. Non obstructed bowel gas pattern, no free air. Small to moderate
volume retained stool in the rectum.
2. Small left greater than right pleural effusions. Stable
cardiomegaly.
